# Patient Record
Sex: Female | Born: 1980 | Race: White | Hispanic: No | Marital: Married | Smoking: Former smoker
Health system: Southern US, Community
[De-identification: ages and names within clinical notes are randomized; demographics above are authoritative.]

## PROBLEM LIST (undated history)

## (undated) DIAGNOSIS — F32A Depression, unspecified: Secondary | ICD-10-CM

## (undated) DIAGNOSIS — F419 Anxiety disorder, unspecified: Secondary | ICD-10-CM

## (undated) DIAGNOSIS — E041 Nontoxic single thyroid nodule: Secondary | ICD-10-CM

## (undated) DIAGNOSIS — Z803 Family history of malignant neoplasm of breast: Secondary | ICD-10-CM

## (undated) DIAGNOSIS — D7589 Other specified diseases of blood and blood-forming organs: Secondary | ICD-10-CM

## (undated) DIAGNOSIS — R87629 Unspecified abnormal cytological findings in specimens from vagina: Secondary | ICD-10-CM

## (undated) DIAGNOSIS — F329 Major depressive disorder, single episode, unspecified: Secondary | ICD-10-CM

## (undated) DIAGNOSIS — R519 Headache, unspecified: Secondary | ICD-10-CM

## (undated) DIAGNOSIS — T8859XA Other complications of anesthesia, initial encounter: Secondary | ICD-10-CM

## (undated) DIAGNOSIS — IMO0001 Reserved for inherently not codable concepts without codable children: Secondary | ICD-10-CM

## (undated) DIAGNOSIS — K8020 Calculus of gallbladder without cholecystitis without obstruction: Secondary | ICD-10-CM

## (undated) DIAGNOSIS — N970 Female infertility associated with anovulation: Secondary | ICD-10-CM

## (undated) DIAGNOSIS — R51 Headache: Secondary | ICD-10-CM

## (undated) DIAGNOSIS — E8809 Other disorders of plasma-protein metabolism, not elsewhere classified: Secondary | ICD-10-CM

## (undated) DIAGNOSIS — T4145XA Adverse effect of unspecified anesthetic, initial encounter: Secondary | ICD-10-CM

## (undated) DIAGNOSIS — Z8619 Personal history of other infectious and parasitic diseases: Secondary | ICD-10-CM

## (undated) DIAGNOSIS — O43129 Velamentous insertion of umbilical cord, unspecified trimester: Secondary | ICD-10-CM

## (undated) DIAGNOSIS — K219 Gastro-esophageal reflux disease without esophagitis: Secondary | ICD-10-CM

## (undated) HISTORY — DX: Personal history of other infectious and parasitic diseases: Z86.19

## (undated) HISTORY — DX: Gastro-esophageal reflux disease without esophagitis: K21.9

## (undated) HISTORY — DX: Unspecified abnormal cytological findings in specimens from vagina: R87.629

## (undated) HISTORY — DX: Other complications of anesthesia, initial encounter: T88.59XA

## (undated) HISTORY — DX: Anxiety disorder, unspecified: F41.9

## (undated) HISTORY — PX: INDUCED ABORTION: SHX677

## (undated) HISTORY — PX: CHOLECYSTECTOMY: SHX55

## (undated) HISTORY — DX: Family history of malignant neoplasm of breast: Z80.3

## (undated) HISTORY — DX: Major depressive disorder, single episode, unspecified: F32.9

## (undated) HISTORY — DX: Female infertility associated with anovulation: N97.0

## (undated) HISTORY — PX: UPPER GI ENDOSCOPY: SHX6162

## (undated) HISTORY — PX: COLONOSCOPY: SHX174

## (undated) HISTORY — DX: Adverse effect of unspecified anesthetic, initial encounter: T41.45XA

## (undated) HISTORY — DX: Other specified diseases of blood and blood-forming organs: D75.89

## (undated) HISTORY — DX: Depression, unspecified: F32.A

---

## 2008-03-11 ENCOUNTER — Other Ambulatory Visit: Admission: RE | Admit: 2008-03-11 | Discharge: 2008-03-11 | Payer: Self-pay | Admitting: Family Medicine

## 2008-03-15 ENCOUNTER — Encounter: Admission: RE | Admit: 2008-03-15 | Discharge: 2008-03-15 | Payer: Self-pay | Admitting: Gastroenterology

## 2008-03-15 ENCOUNTER — Encounter: Admission: RE | Admit: 2008-03-15 | Discharge: 2008-03-15 | Payer: Self-pay | Admitting: Family Medicine

## 2008-06-06 ENCOUNTER — Emergency Department (HOSPITAL_COMMUNITY): Admission: EM | Admit: 2008-06-06 | Discharge: 2008-06-06 | Payer: Self-pay | Admitting: Emergency Medicine

## 2008-11-08 ENCOUNTER — Emergency Department (HOSPITAL_COMMUNITY): Admission: EM | Admit: 2008-11-08 | Discharge: 2008-11-09 | Payer: Self-pay | Admitting: Emergency Medicine

## 2009-05-01 ENCOUNTER — Other Ambulatory Visit: Admission: RE | Admit: 2009-05-01 | Discharge: 2009-05-01 | Payer: Self-pay | Admitting: Family Medicine

## 2009-09-18 ENCOUNTER — Encounter: Admission: RE | Admit: 2009-09-18 | Discharge: 2009-09-18 | Payer: Self-pay | Admitting: Gastroenterology

## 2009-11-02 ENCOUNTER — Ambulatory Visit (HOSPITAL_COMMUNITY): Admission: RE | Admit: 2009-11-02 | Discharge: 2009-11-03 | Payer: Self-pay | Admitting: General Surgery

## 2009-11-02 ENCOUNTER — Encounter (INDEPENDENT_AMBULATORY_CARE_PROVIDER_SITE_OTHER): Payer: Self-pay | Admitting: General Surgery

## 2010-03-25 ENCOUNTER — Encounter: Payer: Self-pay | Admitting: Gastroenterology

## 2010-05-18 LAB — COMPREHENSIVE METABOLIC PANEL
AST: 29 U/L (ref 0–37)
Alkaline Phosphatase: 58 U/L (ref 39–117)
Calcium: 10 mg/dL (ref 8.4–10.5)
Chloride: 106 mEq/L (ref 96–112)
Creatinine, Ser: 0.63 mg/dL (ref 0.4–1.2)
GFR calc non Af Amer: >60 mL/min (ref >60)
Potassium: 3.7 mEq/L (ref 3.5–5.1)
Sodium: 139 mEq/L (ref 135–145)
Total Bilirubin: 0.5 mg/dL (ref 0.3–1.2)

## 2010-05-18 LAB — CBC
MCHC: 34.5 g/dL (ref 30.0–36.0)
MCV: 84 fL (ref 78.0–100.0)
RDW: 12.9 % (ref 11.5–15.5)

## 2010-05-18 LAB — DIFFERENTIAL
Basophils Relative: 0 % (ref 0–1)
Lymphocytes Relative: 26 % (ref 12–46)
Lymphs Abs: 2.5 10*3/uL (ref 0.7–4.0)
Monocytes Absolute: 0.7 10*3/uL (ref 0.1–1.0)

## 2010-05-18 LAB — PREGNANCY, URINE: Preg Test, Ur: NEGATIVE

## 2010-05-18 LAB — SURGICAL PCR SCREEN: Staphylococcus aureus: POSITIVE — AB

## 2010-06-19 ENCOUNTER — Other Ambulatory Visit: Payer: Self-pay | Admitting: Family Medicine

## 2010-06-19 ENCOUNTER — Other Ambulatory Visit (HOSPITAL_COMMUNITY)
Admission: RE | Admit: 2010-06-19 | Discharge: 2010-06-19 | Disposition: A | Discharge status: Home / Self Care | Payer: Managed Care, Other (non HMO) | Source: Ambulatory Visit | Attending: Family Medicine | Admitting: Family Medicine

## 2010-06-19 DIAGNOSIS — E049 Nontoxic goiter, unspecified: Secondary | ICD-10-CM

## 2010-06-19 DIAGNOSIS — Z124 Encounter for screening for malignant neoplasm of cervix: Secondary | ICD-10-CM | POA: Insufficient documentation

## 2010-06-22 ENCOUNTER — Other Ambulatory Visit: Payer: Self-pay

## 2010-06-26 ENCOUNTER — Ambulatory Visit
Admission: RE | Admit: 2010-06-26 | Discharge: 2010-06-26 | Disposition: A | Discharge status: Home / Self Care | Payer: Managed Care, Other (non HMO) | Source: Ambulatory Visit | Attending: Family Medicine | Admitting: Family Medicine

## 2010-06-26 DIAGNOSIS — E049 Nontoxic goiter, unspecified: Secondary | ICD-10-CM

## 2010-10-23 ENCOUNTER — Emergency Department (HOSPITAL_COMMUNITY): Payer: No Typology Code available for payment source

## 2010-10-23 ENCOUNTER — Observation Stay (HOSPITAL_COMMUNITY)
Admission: EM | Admit: 2010-10-23 | Discharge: 2010-10-24 | Disposition: A | Discharge status: Home / Self Care | Payer: No Typology Code available for payment source | Attending: General Surgery | Admitting: General Surgery

## 2010-10-23 DIAGNOSIS — K219 Gastro-esophageal reflux disease without esophagitis: Secondary | ICD-10-CM | POA: Insufficient documentation

## 2010-10-23 DIAGNOSIS — Z331 Pregnant state, incidental: Secondary | ICD-10-CM

## 2010-10-23 DIAGNOSIS — S5010XA Contusion of unspecified forearm, initial encounter: Secondary | ICD-10-CM | POA: Insufficient documentation

## 2010-10-23 DIAGNOSIS — O9989 Other specified diseases and conditions complicating pregnancy, childbirth and the puerperium: Principal | ICD-10-CM | POA: Insufficient documentation

## 2010-10-23 DIAGNOSIS — Y92410 Unspecified street and highway as the place of occurrence of the external cause: Secondary | ICD-10-CM | POA: Insufficient documentation

## 2010-10-23 DIAGNOSIS — S301XXA Contusion of abdominal wall, initial encounter: Secondary | ICD-10-CM | POA: Insufficient documentation

## 2010-10-23 DIAGNOSIS — J45909 Unspecified asthma, uncomplicated: Secondary | ICD-10-CM | POA: Insufficient documentation

## 2010-10-23 LAB — DIFFERENTIAL
Lymphocytes Relative: 18 % (ref 12–46)
Monocytes Absolute: 0.8 10*3/uL (ref 0.1–1.0)
Neutro Abs: 8.1 10*3/uL — ABNORMAL HIGH (ref 1.7–7.7)
Neutrophils Relative %: 74 % (ref 43–77)

## 2010-10-23 LAB — COMPREHENSIVE METABOLIC PANEL
Albumin: 3.7 g/dL (ref 3.5–5.2)
Alkaline Phosphatase: 63 U/L (ref 39–117)
BUN: 8 mg/dL (ref 6–23)
Chloride: 105 mEq/L (ref 96–112)
Glucose, Bld: 105 mg/dL — ABNORMAL HIGH (ref 70–99)
Total Bilirubin: 0.3 mg/dL (ref 0.3–1.2)
Total Protein: 6.9 g/dL (ref 6.0–8.3)

## 2010-10-23 LAB — TYPE AND SCREEN
ABO/RH(D): O POS
Antibody Screen: NEGATIVE

## 2010-10-23 LAB — MRSA PCR SCREENING: MRSA by PCR: NEGATIVE

## 2010-10-23 LAB — CBC
MCH: 29.3 pg (ref 26.0–34.0)
MCV: 83.2 fL (ref 78.0–100.0)
RBC: 4.81 MIL/uL (ref 3.87–5.11)
RDW: 13.3 % (ref 11.5–15.5)
WBC: 11 10*3/uL — ABNORMAL HIGH (ref 4.0–10.5)

## 2010-10-24 LAB — CBC
HCT: 38.4 % (ref 36.0–46.0)
MCHC: 34.4 g/dL (ref 30.0–36.0)
Platelets: 285 10*3/uL (ref 150–400)
RBC: 4.55 MIL/uL (ref 3.87–5.11)
WBC: 7.8 10*3/uL (ref 4.0–10.5)

## 2010-10-24 LAB — DIFFERENTIAL
Basophils Relative: 1 % (ref 0–1)
Eosinophils Absolute: 0.1 10*3/uL (ref 0.0–0.7)
Eosinophils Relative: 2 % (ref 0–5)
Lymphs Abs: 2.2 10*3/uL (ref 0.7–4.0)
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
## 2010-12-06 LAB — ABO/RH

## 2010-12-06 LAB — GC/CHLAMYDIA PROBE AMP, GENITAL: Chlamydia: NEGATIVE

## 2011-03-05 HISTORY — DX: Velamentous insertion of umbilical cord, unspecified trimester: O43.129

## 2011-03-05 NOTE — L&D Delivery Note (Signed)
Delivery Note  Complete dilation at 0122 Onset of pushing at 0130 FHR second stage 140's; no decelerations present during intermittent assessment  Anesthesia: none  Delivery of a viable female infant at 0230 by L. Lorre Munroe, SNM and Wiliam Ke, CNM in lithotomy position; OA-ROA.  Nuchal Cord: none Cord double clamped after cessation of pulsation, cut by FOB; 3 vessels noted Cord blood sample collected for T&S  Partial separation of placenta at 0245; 3VC with velamentous insertion and avulsion at vessels with gentle cord traction.  Bleeding minimal; attempted manual removal of placenta by T. Fredric Mare x 3 attempts. Adherent placenta in fundus.  Dr. Seymour Bars consulted at 0300 for retained and adherent placenta. PCN allergy; flagyl 500mg  IV now to continue x3 doses for prophylaxis  2nd degree laceration identified.  Repair deferred following D&E  Est. Blood Loss (mL): 300  Complications: retained and adherent placenta; avulsion of cord at velamentous insertion  Mom to OR.  Baby to nursery-stable.  Juanetta Beets, SNM Marlinda Mike 05/15/2011, 3:34 AM

## 2011-05-13 ENCOUNTER — Encounter (HOSPITAL_COMMUNITY): Payer: Self-pay | Admitting: *Deleted

## 2011-05-13 ENCOUNTER — Telehealth (HOSPITAL_COMMUNITY): Payer: Self-pay | Admitting: *Deleted

## 2011-05-13 NOTE — Telephone Encounter (Signed)
Preadmission screen  

## 2011-05-14 ENCOUNTER — Inpatient Hospital Stay (HOSPITAL_COMMUNITY)
Admission: AD | Admit: 2011-05-14 | Discharge: 2011-05-17 | DRG: 767 | Disposition: A | Discharge status: Home / Self Care | Payer: Managed Care, Other (non HMO) | Attending: Obstetrics and Gynecology | Admitting: Obstetrics and Gynecology

## 2011-05-14 ENCOUNTER — Encounter (HOSPITAL_COMMUNITY): Payer: Self-pay | Admitting: *Deleted

## 2011-05-14 DIAGNOSIS — O43129 Velamentous insertion of umbilical cord, unspecified trimester: Secondary | ICD-10-CM | POA: Diagnosis present

## 2011-05-14 DIAGNOSIS — O139 Gestational [pregnancy-induced] hypertension without significant proteinuria, unspecified trimester: Secondary | ICD-10-CM | POA: Diagnosis present

## 2011-05-14 DIAGNOSIS — IMO0001 Reserved for inherently not codable concepts without codable children: Secondary | ICD-10-CM

## 2011-05-14 DIAGNOSIS — Z23 Encounter for immunization: Secondary | ICD-10-CM

## 2011-05-14 HISTORY — DX: Reserved for inherently not codable concepts without codable children: IMO0001

## 2011-05-14 LAB — CBC
MCH: 25.4 pg — ABNORMAL LOW (ref 26.0–34.0)
Platelets: 293 10*3/uL (ref 150–400)
RBC: 4.81 MIL/uL (ref 3.87–5.11)
WBC: 14 10*3/uL — ABNORMAL HIGH (ref 4.0–10.5)

## 2011-05-14 LAB — COMPREHENSIVE METABOLIC PANEL
AST: 19 U/L (ref 0–37)
CO2: 22 mEq/L (ref 19–32)
Calcium: 9.7 mg/dL (ref 8.4–10.5)
Creatinine, Ser: 0.66 mg/dL (ref 0.50–1.10)
GFR calc non Af Amer: >90 mL/min (ref >90)

## 2011-05-14 LAB — RPR: RPR Ser Ql: NONREACTIVE

## 2011-05-14 MED ORDER — PROMETHAZINE HCL 25 MG/ML IJ SOLN
12.5000 mg | INTRAMUSCULAR | Status: AC
  Administered 2011-05-14: 12.5 mg via INTRAVENOUS
  Filled 2011-05-14: qty 1

## 2011-05-14 MED ORDER — PANTOPRAZOLE SODIUM 40 MG PO TBEC
40.0000 mg | DELAYED_RELEASE_TABLET | Freq: Every day | ORAL | Status: DC
  Administered 2011-05-14: 40 mg via ORAL
  Filled 2011-05-14 (×2): qty 1

## 2011-05-14 MED ORDER — LACTATED RINGERS IV BOLUS (SEPSIS): 500.0000 mL | Freq: Once | INTRAVENOUS | Status: DC

## 2011-05-14 MED ORDER — LACTATED RINGERS IV SOLN
INTRAVENOUS | Status: DC
  Administered 2011-05-14: 20:00:00 via INTRAVENOUS

## 2011-05-14 MED ORDER — OXYTOCIN 20 UNITS IN LACTATED RINGERS INFUSION - SIMPLE
125.0000 mL/h | Freq: Once | INTRAVENOUS | Status: AC
  Administered 2011-05-15: 999 mL/h via INTRAVENOUS

## 2011-05-14 MED ORDER — LACTATED RINGERS IV SOLN: 500.0000 mL | INTRAVENOUS | Status: DC | PRN

## 2011-05-14 MED ORDER — ONDANSETRON HCL 4 MG/2ML IJ SOLN: 4.0000 mg | Freq: Four times a day (QID) | INTRAMUSCULAR | Status: DC | PRN

## 2011-05-14 MED ORDER — RISAQUAD PO CAPS
1.0000 | ORAL_CAPSULE | Freq: Every day | ORAL | Status: DC
  Administered 2011-05-14: 1 via ORAL
  Filled 2011-05-14 (×2): qty 1

## 2011-05-14 MED ORDER — OXYTOCIN 10 UNIT/ML IJ SOLN: 10.0000 [IU] | Freq: Once | INTRAMUSCULAR | Status: DC

## 2011-05-14 MED ORDER — NALBUPHINE SYRINGE 5 MG/0.5 ML
10.0000 mg | INJECTION | INTRAMUSCULAR | Status: DC | PRN
  Administered 2011-05-14: 10 mg via SUBCUTANEOUS
  Filled 2011-05-14: qty 0.5
  Filled 2011-05-14: qty 1

## 2011-05-14 MED ORDER — FLEET ENEMA 7-19 GM/118ML RE ENEM: 1.0000 | ENEMA | RECTAL | Status: DC | PRN

## 2011-05-14 MED ORDER — CITRIC ACID-SODIUM CITRATE 334-500 MG/5ML PO SOLN
30.0000 mL | ORAL | Status: DC | PRN
  Administered 2011-05-15: 30 mL via ORAL
  Filled 2011-05-14: qty 15

## 2011-05-14 MED ORDER — OXYTOCIN 20 UNITS IN LACTATED RINGERS INFUSION - SIMPLE
1.0000 m[IU]/min | INTRAVENOUS | Status: DC
  Administered 2011-05-14: 2 m[IU]/min via INTRAVENOUS
  Administered 2011-05-14: 12 m[IU]/min via INTRAVENOUS
  Filled 2011-05-14: qty 1000

## 2011-05-14 MED ORDER — ACETAMINOPHEN 325 MG PO TABS: 650.0000 mg | ORAL_TABLET | ORAL | Status: DC | PRN

## 2011-05-14 MED ORDER — OXYTOCIN BOLUS FROM INFUSION
500.0000 mL | Freq: Once | INTRAVENOUS | Status: DC
  Filled 2011-05-14: qty 500

## 2011-05-14 MED ORDER — PROMETHAZINE HCL 25 MG/ML IJ SOLN
12.5000 mg | INTRAMUSCULAR | Status: AC
  Administered 2011-05-15: 12.5 mg via INTRAVENOUS
  Filled 2011-05-14 (×2): qty 1

## 2011-05-14 MED ORDER — OXYCODONE-ACETAMINOPHEN 5-325 MG PO TABS: 1.0000 | ORAL_TABLET | ORAL | Status: DC | PRN

## 2011-05-14 MED ORDER — NALBUPHINE SYRINGE 5 MG/0.5 ML
10.0000 mg | INJECTION | Freq: Once | INTRAMUSCULAR | Status: AC
  Administered 2011-05-14: 10 mg via INTRAVENOUS
  Filled 2011-05-14: qty 1

## 2011-05-14 MED ORDER — IBUPROFEN 600 MG PO TABS: 600.0000 mg | ORAL_TABLET | Freq: Four times a day (QID) | ORAL | Status: DC | PRN

## 2011-05-14 MED ORDER — LIDOCAINE HCL (PF) 1 % IJ SOLN
30.0000 mL | INTRAMUSCULAR | Status: DC | PRN
  Filled 2011-05-14: qty 30

## 2011-05-14 MED ORDER — OMEPRAZOLE MAGNESIUM 20 MG PO TBEC: 20.0000 mg | DELAYED_RELEASE_TABLET | Freq: Every morning | ORAL | Status: DC

## 2011-05-14 MED ORDER — NALBUPHINE SYRINGE 5 MG/0.5 ML
10.0000 mg | INJECTION | INTRAMUSCULAR | Status: AC
  Administered 2011-05-15: 10 mg via INTRAVENOUS
  Filled 2011-05-14: qty 0.5

## 2011-05-14 NOTE — Progress Notes (Signed)
S:       Feeling frustrated and having difficulty coping with labor at this time; requesting analgesia     Tolerating contractions fair-poor despite hydrotherapy and multiple position changes/continuous          one:one labor support.  O:  VS: Blood pressure 149/89, pulse 108, temperature 98.5 F (36.9 C), temperature source Axillary, resp. rate 20, height 5\' 2"  (1.575 m), weight 97.07 kg (214 lb). Repeat BP 159/96        FHR : baseline 140 / variability min-mod / accels + / decels episodic early        Toco: contractions every 2-3 minutes / 45-60 / irregular intensity mild-mod via palpation        Cervix : 7/90/0 (+1 station during cxn)        Membranes: R/ clear  A: Protracted active phase labor     FHR category II      Ineffective coping     Elevated BP  P:  Extensive discussion regarding options regarding IV narcotics vs epidural vs expectant               management/comfort measures       IV nubain for analgesia per request       Continue oxytocin augmentation       Continuous EFM and toco       Re-assess within 1-2 hours with anticipated need for regional anesthesia; low threshold to              recommend c/s if no labor progress at next cervical assessment.     Juanetta Beets SNM Marlinda Mike 05/14/2011, 11:22 PM

## 2011-05-14 NOTE — Progress Notes (Signed)
Patient moaning during contractions but falls asleep in between. Lauren [name] is rubbing her back and applying counter-pressure during contractions.  Contractions are 2-3 minutes apart but short in duration.

## 2011-05-14 NOTE — Progress Notes (Signed)
Colon Flattery notified of pt presenting for labor check.  En route to hospital.  Will be in to check pt.

## 2011-05-14 NOTE — Progress Notes (Signed)
Tub temp 98.7

## 2011-05-14 NOTE — Progress Notes (Signed)
Patient C/O repeated toco and ultrasound adjustments, can we take off. Explained needed due to Pitocin administration and will stop adjustments as soon as possible. Candise Che, RN

## 2011-05-14 NOTE — MAU Note (Signed)
Pt states she had her membrames stripped in the office yesterday and has been contracting all night

## 2011-05-14 NOTE — Progress Notes (Signed)
S: Feeling increasing fatigue and decreased pain tolerance for ctx.      O:  VS: Blood pressure 140/94, pulse 110, temperature 99.1 F (37.3 C), temperature source Oral, resp. rate 18, height 5\' 2"  (1.575 m), weight 214 lb (97.07 kg).        FHR: baseline 150 / variability mod / accels present / decels early        Toco: contractions every 2-3 minutes /45-60 seconds/ MVU's 150-170        Cervix : 5/90/0        Membranes: R/clear  A: protracted active phase of labor     FHR category 1  P: IUPC placed for assessment of MVU    Extensive discussion regarding risks/benefits of augmentation versus expectant management     DC IUPC following MVU assessment per client request to maintain mobility and ability to utilize birthing tub for hydrotherapy    Oxytocin augmentation    Nubain/phenergan for rest    IV hydration    Juanetta Beets, SNM Marlinda Mike 05/14/2011, 5:34 PM

## 2011-05-14 NOTE — MAU Note (Signed)
0725 peri care provided.  Pad/panties on. Pt breathing well with ctx's.  Husband, Mellody Dance and doula, Maralyn Sago at bedside- supportive.  Sheliah Hatch CNM in discussing plan of care (is available by phone) orders place.  Pt may eat, pain med (nubaine sq) available is needed/desired.

## 2011-05-14 NOTE — Progress Notes (Signed)
Patient moved to water, a lot of time spent adjusting monitor and trying to pick up continuous FHR with telemetry. Doula tubside and Mellody Dance in tub with patient. Pt complaining about straps for monitor. Candise Che, RN

## 2011-05-14 NOTE — Progress Notes (Signed)
Lauren [name] is a 31 y.o. G3P0020 at [redacted]w[redacted]d by LMP admitted for rupture of membranes, latent labor  Subjective: Reports increased intensity in ctx since shower 30 min ago, (+) rectal pressure with some ctx, laboring in BR on toilet. Coping well with ctx, using breathing techniques for relaxation. PO hydration, voids small amounts q 30-40 min.  Denies HA / N/V, epigastric pain  Objective: BP 141/94  Pulse 102  Temp(Src) 99.1 F (37.3 C) (Oral)  Resp 20  Ht 5\' 2"  (1.575 m)  Wt 97.07 kg (214 lb)  BMI 39.14 kg/m2       FHT:  FHR: 140 bpm, variability moderate, no decel's, intermittent EFM UC:   regular, every 2-4 minutes SVE:   Dilation: 5 Effacement (%): 90 Station: -1 Exam by:: Renae Fickle, CNM  Labs: pending  Assessment / Plan: Spontaneous labor, progressing normally  Labor: latent phase Preeclampsia:  no signs or symptoms of toxicity, elevated BP with labor pains, will draw PIH labs for baseline Fetal Wellbeing:  Category I Pain Control:  Labor support without medications and hydrotherapy I/D:  n/a Anticipated MOD:  NSVD  Jullien Granquist 05/14/2011, 10:41 AM

## 2011-05-14 NOTE — Progress Notes (Addendum)
Patient ID: Lauren [name], female   DOB: 14-Dec-1980, 30 y.o.   MRN: [id number]  S: Feeling increasing intensity and frequency of uterine ctx; reports some low back discomfort and pressure. Coping well.   O:  VS: Blood pressure 135/80, pulse 82, temperature 99 F (37.2 C), temperature source Oral, resp. rate 20, height 5\' 2"  (1.575 m), weight 97.07 kg (214 lb).         FHR : Intermittent assessment: 145; acceleration present with scalp stimulation.          Toco: contractions every 2-4 minutes lasting 50-60 seconds; moderate intensity to palpation        Cervix : 5/90/0        Membranes: R/clear        Position: LOP        PIH labs: reviewed; NML: no evidence of preeclampsia    A: Spontaneous active phase labor; normal FHR characteristics; posterior occiput position     P: Expectant management; hydrotherapy/doula support prn comfort; abdominal binding with rebozo and upright/lateral positioning to facilitate improved fetal positioning; anticipate NSB.    Juanetta Beets, SNM Lauren [name] 05/14/2011, 1:55 PM

## 2011-05-14 NOTE — H&P (Signed)
KATHERYNE GORR is a 31 y.o. G3P0020 at [redacted]w[redacted]d presenting for labor check. Pt notes regular contractions since membrane strip in office yesterday (was 3/80/-1 at that time), now with increased intensity since 5:30 am. Good fetal movement, No vaginal bleeding, no leaking fluid. Coping well with contractions, using Elige Radon method and spouse / doula for support, plans water birth.   Prenatal Course Source of Care: [facility], Wiliam Ke, CNM primary,  with onset of care at 10 weeks Pregnancy complications or risk: velamentous cord insertion noted on 22 wks sono, monitored in 3rd trim for low nl AFI. Current medications: Prescriptions prior to admission  Medication Sig Dispense Refill  . omeprazole (PRILOSEC OTC) 20 MG tablet Take 20 mg by mouth every morning.       . Prenatal Vit-Fe Fumarate-FA (PRENATAL MULTIVITAMIN) TABS Take 1 tablet by mouth every morning.       . Probiotic Product (PROBIOTIC FORMULA PO) Take 1 capsule by mouth every morning.       . pyridOXINE (VITAMIN B-6) 50 MG tablet Take 50 mg by mouth every morning.        Allergies  Allergen Reactions  . Other Other (See Comments)    Problems with "general anaesthesia" due to pseudocholinesterase deficiency   . Penicillins Other (See Comments)    Childhood allergy; reaction unknown  . Ritalin (Methylphenidate Hcl) Other (See Comments)    ADHD/AntiNarcolepsy/Anti-obesity/ Anorexiants cause muscle spasms    OB History    Grav Para Term Preterm Abortions TAB SAB Ect Mult Living   3 0   2 1 1    0     Past Medical History  Diagnosis Date  . Other umbilical cord complications during labor and delivery, unspecified as to episode of care     velamintous insertion  . Female infertility associated with anovulation   . Anxiety   . Asthma   . Depression   . GERD (gastroesophageal reflux disease)   . Female infertility associated with anovulation   . Family history of malignant neoplasm of breast   . Goiter, unspecified   . History of  chicken pox   . Other specified diseases of blood and blood-forming organs     pseudocholinesterase deficiency  . Nontoxic uninodular goiter   . Complication of anesthesia     states allergic to general anesthesia   Past Surgical History  Procedure Date  . Cholecystectomy   . Colonoscopy    Family History: family history includes Cancer in her maternal grandmother; Hypertension in her mother; and Immunodeficiency in her father. Social History:  does not have a smoking history on file. She does not have any smokeless tobacco history on file. Her alcohol and drug histories not on file.  Review of Systems - Negative except as noted   Dilation: 4 Effacement (%): 100 Station: 0 Exam by:: D Jomel Whittlesey CNM Vertex presentation  Blood pressure 141/88, pulse 111, temperature 98.4 F (36.9 C), temperature source Oral, resp. rate 20, height 5\' 2"  (1.575 m), weight 97.07 kg (214 lb).  Physical Exam:  General: NAD Heart: RRR, no murmurs Lungs: CTA b/l  -Abd: Soft, NT, EFW 7.5 lbs  Ext: no edema Neuro: DTRs normal    Membranes: SROM with exam, cl AF, forebag in place, will defer further manipulation at this time given Hx VCI Vaginal bleeding: none Speculum Exam: n/a  FHR:  Baseline rate 135   Variability moderate  Accelerations present, (+) scalp stim  Decelerations none Contractions: Frequency 4  Duration 60  Intensity  mild/moderate  Pertinent Labs/Studies:  CBC / RPR pending  Prenatal labs: ABO, Rh: O/pos (10/04 0000) Antibody: NEG (08/21 1520) Rubella:  immune RPR: Nonreactive (10/04 0000)  HBsAg: Negative (10/04 0000)  HIV: Non-reactive (10/04 0000)  GBS: Negative (02/08 0000)  1 hr Glucola 128  Genetic screening normal ultrascreen and AFP1 Ultrasound: Weeks 18 Result nl FEMALE anatomy, posterior placenta, velamentous cord insert on F/U scant at 22 wks 40 wks sono - EFW 7-5, nl AFI, AC 39%  Assessment: 31 y.o. G3P0020 at [redacted]w[redacted]d, suspect VCI  1. Labor: latent labor for past  20 hours, expect onset active phase with SROM 2. Fetal Wellbeing: Category 1  3. Pain Control: desires natural labor, low intervention, water birth 4. GBS: neg   Plan:  1. Admit to BS 2. Routine L&D orders 3. Hydrotherapy / labor support by doula and spouse / Analgesia/anesthesia PRN     Consultant: Dr. Alesia Banda 05/14/2011, 7:22 AM

## 2011-05-15 ENCOUNTER — Encounter (HOSPITAL_COMMUNITY)
Admission: AD | Disposition: A | Discharge status: Home / Self Care | Payer: Self-pay | Source: Home / Self Care | Attending: Obstetrics and Gynecology

## 2011-05-15 ENCOUNTER — Inpatient Hospital Stay (HOSPITAL_COMMUNITY): Payer: Managed Care, Other (non HMO) | Admitting: Anesthesiology

## 2011-05-15 ENCOUNTER — Encounter (HOSPITAL_COMMUNITY): Payer: Self-pay

## 2011-05-15 ENCOUNTER — Encounter (HOSPITAL_COMMUNITY): Payer: Self-pay | Admitting: Anesthesiology

## 2011-05-15 HISTORY — PX: DILATION AND EVACUATION: SHX1459

## 2011-05-15 SURGERY — DILATION AND EVACUATION, UTERUS
Anesthesia: Spinal | Site: Uterus | Wound class: Clean Contaminated

## 2011-05-15 MED ORDER — METRONIDAZOLE IN NACL 5-0.79 MG/ML-% IV SOLN
500.0000 mg | Freq: Three times a day (TID) | INTRAVENOUS | Status: AC
  Administered 2011-05-15 – 2011-05-16 (×4): 500 mg via INTRAVENOUS
  Filled 2011-05-15 (×4): qty 100

## 2011-05-15 MED ORDER — HYDROCHLOROTHIAZIDE 25 MG PO TABS
25.0000 mg | ORAL_TABLET | Freq: Every day | ORAL | Status: DC
  Administered 2011-05-15 – 2011-05-17 (×3): 25 mg via ORAL
  Filled 2011-05-15 (×4): qty 1

## 2011-05-15 MED ORDER — SENNOSIDES-DOCUSATE SODIUM 8.6-50 MG PO TABS
2.0000 | ORAL_TABLET | Freq: Every day | ORAL | Status: DC
  Administered 2011-05-15 – 2011-05-16 (×2): 2 via ORAL

## 2011-05-15 MED ORDER — ONDANSETRON HCL 4 MG/2ML IJ SOLN: 4.0000 mg | INTRAMUSCULAR | Status: DC | PRN

## 2011-05-15 MED ORDER — LACTATED RINGERS IV SOLN
INTRAVENOUS | Status: AC | PRN
  Administered 2011-05-15: 11:00:00 via INTRAVENOUS

## 2011-05-15 MED ORDER — OXYTOCIN 10 UNIT/ML IJ SOLN
INTRAMUSCULAR | Status: DC | PRN
  Administered 2011-05-15 (×2): 20 [IU]

## 2011-05-15 MED ORDER — ZOLPIDEM TARTRATE 5 MG PO TABS: 5.0000 mg | ORAL_TABLET | Freq: Every evening | ORAL | Status: DC | PRN

## 2011-05-15 MED ORDER — DIBUCAINE 1 % RE OINT
1.0000 "application " | TOPICAL_OINTMENT | RECTAL | Status: DC | PRN
  Administered 2011-05-15: 1 via RECTAL
  Filled 2011-05-15: qty 28

## 2011-05-15 MED ORDER — SILVER NITRATE-POT NITRATE 75-25 % EX MISC
CUTANEOUS | Status: AC
  Filled 2011-05-15: qty 1

## 2011-05-15 MED ORDER — PHENYLEPHRINE 40 MCG/ML (10ML) SYRINGE FOR IV PUSH (FOR BLOOD PRESSURE SUPPORT)
PREFILLED_SYRINGE | INTRAVENOUS | Status: AC
  Filled 2011-05-15: qty 5

## 2011-05-15 MED ORDER — OXYCODONE-ACETAMINOPHEN 5-325 MG PO TABS
1.0000 | ORAL_TABLET | ORAL | Status: DC | PRN
  Administered 2011-05-15 – 2011-05-16 (×5): 1 via ORAL
  Filled 2011-05-15 (×5): qty 1

## 2011-05-15 MED ORDER — SODIUM CHLORIDE 0.9 % IJ SOLN
3.0000 mL | Freq: Two times a day (BID) | INTRAMUSCULAR | Status: DC
  Administered 2011-05-15 – 2011-05-16 (×3): 3 mL via INTRAVENOUS

## 2011-05-15 MED ORDER — BENZOCAINE-MENTHOL 20-0.5 % EX AERO
INHALATION_SPRAY | CUTANEOUS | Status: AC
  Filled 2011-05-15: qty 56

## 2011-05-15 MED ORDER — LANOLIN HYDROUS EX OINT: TOPICAL_OINTMENT | CUTANEOUS | Status: DC | PRN

## 2011-05-15 MED ORDER — WITCH HAZEL-GLYCERIN EX PADS
1.0000 | MEDICATED_PAD | CUTANEOUS | Status: DC | PRN
Start: 2011-05-15 — End: 2011-05-17

## 2011-05-15 MED ORDER — IBUPROFEN 600 MG PO TABS
600.0000 mg | ORAL_TABLET | Freq: Four times a day (QID) | ORAL | Status: DC
  Administered 2011-05-15 – 2011-05-17 (×9): 600 mg via ORAL
  Filled 2011-05-15 (×9): qty 1

## 2011-05-15 MED ORDER — BENZOCAINE-MENTHOL 20-0.5 % EX AERO
1.0000 "application " | INHALATION_SPRAY | CUTANEOUS | Status: DC | PRN
  Administered 2011-05-15: 1 via TOPICAL

## 2011-05-15 MED ORDER — PHENYLEPHRINE HCL 10 MG/ML IJ SOLN
INTRAMUSCULAR | Status: DC | PRN
  Administered 2011-05-15: 40 ug via INTRAVENOUS
  Administered 2011-05-15 (×3): 80 ug via INTRAVENOUS

## 2011-05-15 MED ORDER — DIPHENHYDRAMINE HCL 25 MG PO CAPS: 25.0000 mg | ORAL_CAPSULE | Freq: Four times a day (QID) | ORAL | Status: DC | PRN

## 2011-05-15 MED ORDER — KETOROLAC TROMETHAMINE 30 MG/ML IJ SOLN
INTRAMUSCULAR | Status: DC | PRN
  Administered 2011-05-15: 30 mg via INTRAVENOUS

## 2011-05-15 MED ORDER — ONDANSETRON HCL 4 MG PO TABS: 4.0000 mg | ORAL_TABLET | ORAL | Status: DC | PRN

## 2011-05-15 MED ORDER — SIMETHICONE 80 MG PO CHEW: 80.0000 mg | CHEWABLE_TABLET | ORAL | Status: DC | PRN

## 2011-05-15 MED ORDER — OXYTOCIN 10 UNIT/ML IJ SOLN
INTRAMUSCULAR | Status: AC
  Filled 2011-05-15: qty 4

## 2011-05-15 MED ORDER — PRENATAL MULTIVITAMIN CH
1.0000 | ORAL_TABLET | Freq: Every day | ORAL | Status: DC
  Administered 2011-05-15 – 2011-05-17 (×2): 1 via ORAL
  Filled 2011-05-15 (×3): qty 1

## 2011-05-15 MED ORDER — TETANUS-DIPHTH-ACELL PERTUSSIS 5-2.5-18.5 LF-MCG/0.5 IM SUSP
0.5000 mL | Freq: Once | INTRAMUSCULAR | Status: AC
  Administered 2011-05-16: 0.5 mL via INTRAMUSCULAR
  Filled 2011-05-15: qty 0.5

## 2011-05-15 MED ORDER — LACTATED RINGERS IV SOLN
INTRAVENOUS | Status: DC | PRN
  Administered 2011-05-15 (×2): via INTRAVENOUS

## 2011-05-15 MED ORDER — FENTANYL CITRATE 0.05 MG/ML IJ SOLN: 25.0000 ug | INTRAMUSCULAR | Status: DC | PRN

## 2011-05-15 SURGICAL SUPPLY — 21 items
CATH ROBINSON RED A/P 16FR (CATHETERS) ×2 IMPLANT
CLOTH BEACON ORANGE TIMEOUT ST (SAFETY) ×2 IMPLANT
DECANTER SPIKE VIAL GLASS SM (MISCELLANEOUS) ×2 IMPLANT
GLOVE BIO SURGEON STRL SZ 6.5 (GLOVE) ×4 IMPLANT
GOWN PREVENTION PLUS LG XLONG (DISPOSABLE) ×4 IMPLANT
KIT BERKELEY 1ST TRIMESTER 3/8 (MISCELLANEOUS) IMPLANT
NEEDLE SPNL 22GX3.5 QUINCKE BK (NEEDLE) ×2 IMPLANT
PACK VAGINAL MINOR WOMEN LF (CUSTOM PROCEDURE TRAY) ×2 IMPLANT
PAD PREP 24X48 CUFFED NSTRL (MISCELLANEOUS) ×2 IMPLANT
SET BERKELEY SUCTION TUBING (SUCTIONS) IMPLANT
SLEEVE SURGEON STRL (DRAPES) ×2 IMPLANT
SUT VIC AB 3-0 PS2 18 (SUTURE) ×2
SUT VIC AB 3-0 PS2 18XBRD (SUTURE) ×2 IMPLANT
SUT VICRYL RAPIDE 3 0 (SUTURE) ×4 IMPLANT
SYR CONTROL 10ML LL (SYRINGE) ×2 IMPLANT
TOWEL OR 17X24 6PK STRL BLUE (TOWEL DISPOSABLE) ×4 IMPLANT
VACURETTE 10 RIGID CVD (CANNULA) IMPLANT
VACURETTE 7MM CVD STRL WRAP (CANNULA) IMPLANT
VACURETTE 8 RIGID CVD (CANNULA) IMPLANT
VACURETTE 9 RIGID CVD (CANNULA) IMPLANT
WATER STERILE IRR 1000ML POUR (IV SOLUTION) IMPLANT

## 2011-05-15 NOTE — Anesthesia Preprocedure Evaluation (Signed)
Anesthesia Evaluation  Patient identified by MRN, date of birth, ID band Patient awake    Reviewed: Allergy & Precautions, H&P , Patient's Chart, lab work & pertinent test results  History of Anesthesia Complications (+) PSEUDOCHOLINESTERASE DEFICIENCY  Airway Mallampati: III TM Distance: >3 FB Neck ROM: full    Dental No notable dental hx.    Pulmonary  breath sounds clear to auscultation  Pulmonary exam normal       Cardiovascular Exercise Tolerance: Good Rhythm:regular Rate:Normal     Neuro/Psych    GI/Hepatic   Endo/Other  Morbid obesity  Renal/GU      Musculoskeletal   Abdominal   Peds  Hematology   Anesthesia Other Findings   Reproductive/Obstetrics                           Anesthesia Physical  Anesthesia Plan  ASA: III  Anesthesia Plan: Spinal   Post-op Pain Management:    Induction:   Airway Management Planned:   Additional Equipment:   Intra-op Plan:   Post-operative Plan:   Informed Consent: I have reviewed the patients History and Physical, chart, labs and discussed the procedure including the risks, benefits and alternatives for the proposed anesthesia with the patient or authorized representative who has indicated his/her understanding and acceptance.   Dental Advisory Given  Plan Discussed with: CRNA  Anesthesia Plan Comments: (Lab work confirmed with CRNA in room. Platelets okay. Discussed spinal anesthetic, and patient consents to the procedure:  included risk of possible headache,backache, failed block, allergic reaction, and nerve injury. This patient was asked if she had any questions or concerns before the procedure started. )        Anesthesia Quick Evaluation                                  Anesthesia Evaluation    History of Anesthesia Complications (+) PSEUDOCHOLINESTERASE DEFICIENCY  Airway       Dental    Pulmonary          Cardiovascular     Neuro/Psych    GI/Hepatic   Endo/Other  Morbid obesity  Renal/GU      Musculoskeletal   Abdominal   Peds  Hematology   Anesthesia Other Findings   Reproductive/Obstetrics                           Anesthesia Physical Anesthesia Plan  ASA: III  Anesthesia Plan: Spinal   Post-op Pain Management:    Induction:   Airway Management Planned:   Additional Equipment:   Intra-op Plan:   Post-operative Plan:   Informed Consent: I have reviewed the patients History and Physical, chart, labs and discussed the procedure including the risks, benefits and alternatives for the proposed anesthesia with the patient or authorized representative who has indicated his/her understanding and acceptance.   Dental Advisory Given  Plan Discussed with: CRNA  Anesthesia Plan Comments: (Lab work confirmed with CRNA in room. Platelets okay. Discussed spinal anesthetic, and patient consents to the procedure:  included risk of possible headache,backache, failed block, allergic reaction, and nerve injury. This patient was asked if she had any questions or concerns before the procedure started. )        Anesthesia Quick Evaluation

## 2011-05-15 NOTE — Transfer of Care (Signed)
Immediate Anesthesia Transfer of Care Note  Patient: Lauren [name]  Procedure(s) Performed: Procedure(s) (LRB): DILATATION AND EVACUATION (N/A)  Patient Location: PACU  Anesthesia Type: Spinal  Level of Consciousness: awake, alert  and oriented  Airway & Oxygen Therapy: Patient Spontanous Breathing  Post-op Assessment: Report given to PACU RN and Post -op Vital signs reviewed and stable  Post vital signs: stable  Complications: No apparent anesthesia complications

## 2011-05-15 NOTE — Progress Notes (Signed)
VSS, Awaiting OR

## 2011-05-15 NOTE — Progress Notes (Signed)
See delivery record

## 2011-05-15 NOTE — Anesthesia Procedure Notes (Addendum)
Spinal  Patient location during procedure: OR Preanesthetic Checklist Completed: patient identified, site marked, surgical consent, pre-op evaluation, timeout performed, IV checked, risks and benefits discussed and monitors and equipment checked Spinal Block Patient position: sitting Prep: DuraPrep Patient monitoring: heart rate, cardiac monitor, continuous pulse ox and blood pressure Approach: midline Location: L3-4 Injection technique: single-shot Needle Needle type: Sprotte  Needle gauge: 24 G Needle length: 9 cm Assessment Sensory level: T4 Additional Notes Spinal Dosage in OR  Xylocaine ml       1.2    

## 2011-05-15 NOTE — Progress Notes (Signed)
Anesthesia at the bs

## 2011-05-15 NOTE — Progress Notes (Signed)
Pt stable, Dr Seymour Bars at the bedside for retained placenta. Pt chooses to go to the or .

## 2011-05-15 NOTE — Progress Notes (Signed)
Pt with retained placenta

## 2011-05-15 NOTE — Progress Notes (Signed)
S: Feeling an urge to push; desires to be assessed     Tolerating contractions well since administration of IV      Nubain  O:  VS: Blood pressure 138/79, pulse 102, temperature 99.1 F (37.3 C), temperature source Oral, resp. rate 20, height 5\' 2"  (1.575 m), weight 97.07 kg (214 lb), unknown if currently breastfeeding.        FHR : baseline 140 / variability mod / accels + / decels         early/variable (episodic)        Toco: contractions every 2-3 minutes / mod-strong         intensity to palpation.         Cervix : C/C/+2        Membranes: R/clear  A: Second stage labor     FHR category I-II     Episodic elevation of BP     Coping well, good maternal expulsive efforts  P: Expectant management      Continue oxytocin augmentation      Anticipate NVB      Active management of the third stage      Update Dr. Seymour Bars of progress.    Lauren [name], SNM Lauren [name] 05/15/2011, 3:29 AM

## 2011-05-15 NOTE — Anesthesia Preprocedure Evaluation (Deleted)
Anesthesia Evaluation    History of Anesthesia Complications (+) PSEUDOCHOLINESTERASE DEFICIENCY  Airway       Dental   Pulmonary          Cardiovascular     Neuro/Psych    GI/Hepatic   Endo/Other  Morbid obesity  Renal/GU      Musculoskeletal   Abdominal   Peds  Hematology   Anesthesia Other Findings   Reproductive/Obstetrics                           Anesthesia Physical Anesthesia Plan  ASA: III  Anesthesia Plan: Spinal   Post-op Pain Management:    Induction:   Airway Management Planned:   Additional Equipment:   Intra-op Plan:   Post-operative Plan:   Informed Consent: I have reviewed the patients History and Physical, chart, labs and discussed the procedure including the risks, benefits and alternatives for the proposed anesthesia with the patient or authorized representative who has indicated his/her understanding and acceptance.   Dental Advisory Given  Plan Discussed with: CRNA  Anesthesia Plan Comments: (Lab work confirmed with CRNA in room. Platelets okay. Discussed spinal anesthetic, and patient consents to the procedure:  included risk of possible headache,backache, failed block, allergic reaction, and nerve injury. This patient was asked if she had any questions or concerns before the procedure started. )        Anesthesia Quick Evaluation

## 2011-05-15 NOTE — Progress Notes (Signed)
PPD 0 SVD and D&E for retained / adherent placenta  S:  Reports feeling tired but "overall pretty good"             Tolerating po/ No nausea or vomiting             Bleeding is moderate             Pain controlled with toradol dose in OR & percocet             Up ad lib / ambulatory  Newborn breast feeding  / Girl  ("Lauren [name]")   O:  A & O x 3              VS: Blood pressure 144/100, pulse 86, temperature 98.6 F (37 C), temperature source Oral, resp. rate 20, height 5\' 2"  (1.575 m), weight 97.07 kg (214 lb), SpO2 98.00%, unknown if currently breastfeeding.  LABS: pending  I&O:   + 1000  Lungs: Clear and unlabored  Heart: regular rate and rhythm / no mumurs  Abdomen: soft, non-tender, non-distended              Fundus: firm, non-tender, Ueven  Perineum: moderate edema / + hemorrhoids  Lochia: moderate  Extremities: 1+edema, no calf pain or tenderness    A: PPD # 0 SVD with D&E for retained/adherent placenta             Gestational hypertension - no evidence of PEC  Doing well - stable status  P:  Routine post partum order             Start HCTZ today - monitor BP and I&o  Flagyl 500 IV q8 x 24 hours (prophylaxis uterine exploration)  [name] 05/15/2011, 10:13 AM

## 2011-05-15 NOTE — Anesthesia Postprocedure Evaluation (Signed)
  Anesthesia Post-op Note  Patient: Lauren [name]  Procedure(s) Performed: Procedure(s) (LRB): DILATATION AND EVACUATION (N/A)  Patient is awake, responsive, moving her legs, and has signs of resolution of her numbness. Pain and nausea are reasonably well controlled. Vital signs are stable and clinically acceptable. Oxygen saturation is clinically acceptable. There are no apparent anesthetic complications at this time. Patient is ready for discharge.

## 2011-05-15 NOTE — Op Note (Signed)
05/14/2011 - 05/15/2011  5:21 AM  PATIENT:  Lauren [name]  31 y.o. female  PRE-OPERATIVE DIAGNOSIS:  retained and adherent placenta second degree laceration and bilateral vulva tear  POST-OPERATIVE DIAGNOSIS:  retained and adherent placenta second degree laceration  repeair of bilateral vulva tear  PROCEDURE:  Procedure(s): DILATATION AND EVACUATION, REPAIR OF VULVAR AND PERINEAL TEARS  SURGEON:  Surgeon(s): Genia Del, MD  ASSISTANTS: none   ANESTHESIA:   spinal  PROCEDURE:  Under spinal anesthesia the patient is in lithotomy position. She is prepped with Betadine on the suprapubic, vulvar and vaginal areas.  And draped as usual.  With the left hand bringing the fundus down we used the right-hand to proceed with a manual exploration of the intrauterine cavity. The plane between the placenta and decidua is difficult to find, but a large area of placenta is felt at the fundus.  A small succenturiate lobe had been removed with the cord at the time of delivery.  We succeed removing manually a small portion of placenta. We then insert the weighted speculum grasp the anterior lip of the cervix with a tenaculum and the proceed with a delicate curettage with a large sharp curette at the fundus. This curettage does not bring back much placental tissue but we think that it helped detach it because when we go back manually, we are now able to find the plane between the placenta and decidua and 2 large portions of the placenta are extracted.  The intrauterine cavity appears empty manually and the uterus now contracts very well. We go back with a large sharp curet as well and no further placenta is extracted. The patient was given Pitocin 40 in 1 L. Minimal vaginal bleeding is present.  The to vulvar tears were repaired with Vicryl rapid 3-0 in separate stitches.  The second degree perineal tear was repaired with Vicryl 2-0 and Vicryl rapid 3-0.  Hemostasis was adequate at all levels. The patient was  brought to recovery room in good and stable status. Note that the bladder was catheterized at the beginning of the intervention. The patient received metronidazole 500 mg IV before starting the procedure.  ESTIMATED BLOOD LOSS: 500 cc   Intake/Output Summary (Last 24 hours) at 05/15/11 0521 Last data filed at 05/15/11 0516  Gross per 24 hour  Intake   1700 ml  Output   1100 ml  Net    600 ml     BLOOD ADMINISTERED:none   LOCAL MEDICATIONS USED:  NONE  SPECIMEN:  Source of Specimen:  Placenta  DISPOSITION OF SPECIMEN:  PATHOLOGY  COUNTS:  YES  PLAN OF CARE: Transfer to PACU   [name] MD,  05/15/11 at 5:23 am

## 2011-05-15 NOTE — Progress Notes (Signed)
Wiliam Ke, CNM and L. MunichSNM at the bedside

## 2011-05-16 ENCOUNTER — Encounter (HOSPITAL_COMMUNITY): Payer: Self-pay | Admitting: *Deleted

## 2011-05-16 LAB — CBC
MCH: 25.4 pg — ABNORMAL LOW (ref 26.0–34.0)
MCHC: 32.3 g/dL (ref 30.0–36.0)
Platelets: 306 10*3/uL (ref 150–400)
RDW: 14.7 % (ref 11.5–15.5)

## 2011-05-16 MED ORDER — LABETALOL HCL 100 MG PO TABS
100.0000 mg | ORAL_TABLET | Freq: Two times a day (BID) | ORAL | Status: DC
  Administered 2011-05-16 – 2011-05-17 (×3): 100 mg via ORAL
  Filled 2011-05-16 (×5): qty 1

## 2011-05-16 NOTE — Anesthesia Postprocedure Evaluation (Signed)
  Anesthesia Post-op Note  Patient: Lauren [name]  Procedure(s) Performed: Procedure(s) (LRB): DILATATION AND EVACUATION (N/A)  Patient Location: PACU  Anesthesia Type: General  Level of Consciousness: awake, alert  and oriented  Airway and Oxygen Therapy: Patient Spontanous Breathing  Post-op Pain: none  Post-op Assessment: Post-op Vital signs reviewed and Patient's Cardiovascular Status Stable  Post-op Vital Signs: Reviewed and stable  Complications: No apparent anesthesia complications

## 2011-05-16 NOTE — Progress Notes (Signed)
PPD 1 SVD  S:  Reports feeling well, better since delivery, denies PEC s/s.             Tolerating po/ No nausea or vomiting             Bleeding is light             Pain controlled with Motrin             Up ad lib / ambulatory  Newborn  Information for the patient's newborn:  Darthy, Manganelli [[id number]]  female  breast feeding  / Lactation Consultant at [facility] for BFing support  O:  A & O x 3 NAD, generalized edema             VS:  Filed Vitals:   05/15/11 1200 05/15/11 1800 05/15/11 2100 05/16/11 0500  BP: 111/74 126/85 126/83 136/92  Pulse: 105 100 101 97  Temp: 98.3 F (36.8 C)  98.1 F (36.7 C) 98.3 F (36.8 C)  TempSrc:      Resp: 20 18 20 20   Height:      Weight:      SpO2: 98% 97% 97%     LABS:  Basename 05/16/11 0540 05/14/11 1037  WBC 14.7* 14.0*  HGB 9.5* 12.2  HCT 29.4* 37.5  PLT 306 293    I&O: I/O last 3 completed shifts: In: 2750 [P.O.:300; I.V.:2450] Out: 1400 [Urine:600; Blood:800] Net +700     Lungs: Clear and unlabored  Heart: regular rate and rhythm / no mumurs  Abdomen: soft, non-tender, non-distended              Fundus: firm, non-tender, U-1  Perineum: bruised, repair intact, mild edema  Lochia: small  Extremities:  +2 pedal and pretibial edema, no calf pain or tenderness, neg Homans    A/P: PPD # 1 31 y.o., [id number] SVD 3/13, D&E for retained placenta  Gestational HTN no evidence PEC  On HCTZ for edema, but BP trending up, will start Labetalol 100 mg PO BID    Doing well - stable status  Routine post partum orders  Anticipate discharge home in AM.   Alayla Dethlefs, CNM, MSN 05/16/2011, 10:05 AM

## 2011-05-16 NOTE — Addendum Note (Signed)
Addendum  created 05/16/11 0932 by Orlie Pollen, CRNA   Modules edited:Notes Section

## 2011-05-17 ENCOUNTER — Encounter (HOSPITAL_COMMUNITY): Payer: No Typology Code available for payment source

## 2011-05-17 ENCOUNTER — Encounter (HOSPITAL_COMMUNITY): Payer: Self-pay | Admitting: *Deleted

## 2011-05-17 MED ORDER — HYDROCHLOROTHIAZIDE 25 MG PO TABS: 25.0000 mg | ORAL_TABLET | Freq: Every day | ORAL | Status: AC

## 2011-05-17 MED ORDER — LABETALOL HCL 100 MG PO TABS: 100.0000 mg | ORAL_TABLET | Freq: Two times a day (BID) | ORAL | Status: AC

## 2011-05-17 MED ORDER — IBUPROFEN 600 MG PO TABS: 600.0000 mg | ORAL_TABLET | Freq: Four times a day (QID) | ORAL | Status: AC

## 2011-05-17 MED ORDER — OXYCODONE-ACETAMINOPHEN 5-325 MG PO TABS: 1.0000 | ORAL_TABLET | ORAL | Status: AC | PRN

## 2011-05-17 MED ORDER — BREAST PUMP MISC: 1.0000 [IU] | Status: DC | PRN

## 2011-05-17 NOTE — Discharge Instructions (Signed)
Breast Pumping Tips Pumping your breast milk is a good way to stimulate milk production and have a steady supply of breast milk for your infant. Pumping is most helpful during your infant's growth spurts, when involving dad or a family member, or when you are away. There are several types of pumps available. They can be purchased at a baby or maternity store. You can begin pumping soon after delivery, but some experts believe that you should wait about four weeks to give your infant a bottle. In general, the more you breastfeed or pump, the more milk you will have for your infant. It is also important to take good care of yourself. This will reduce stress and help your body to create a healthy supply of milk. Your caregiver or lactation consultant can give you the information and support you need in your efforts to breastfeed your infant. PUMPING BREAST MILK  Follow the tips below for successful breast pumping. Take care of yourself.  Drink enough water or fluids to keep urine clear or pale yellow. You may notice a thirsty feeling while breastfeeding. This is because your body needs more water to make breast milk. Keep a large water bottle handy. Make healthy drink choices such as unsweetened fruit juice, milk and water. Limit soda, coffee, and alcohol (wait 2 hours to feed or pump if you have an alcoholic drink.)   Eat a healthy, well-balanced diet rich in fruits, vegetables, and whole grains.   Exercise as recommended by your caregiver.   Get plenty of sleep. Sleep when your infant sleeps. Ask friends and family for help if you need time to nap or rest.   Do not smoke. Smoking can lower your milk supply and harm your infant. If you need help quitting, ask your caregiver for a program recommendation.   Ask your caregiver about birth control options. Birth control pills may lower your milk supply. You may be advised to use condoms or other forms of birth control.  Relax and pump Stimulating your  let-down reflex is the key to successful and effective pumping. This makes the milk in all parts of the breast flow more freely.   It is easier to pump breast milk (and breastfeed) while you are relaxed. Find techniques that work for you. Quiet private spaces, breast massage, soothing heat placed on the breast, music, and pictures or a tape recording of your infant may help you to relax and "let down" your milk. If you have difficulty with your let down, try smelling one of your infant's blankets or an item of clothing he or she has worn while you are pumping.   When pumping, place the special suction cup (flange) directly over the nipple. It may be uncomfortable and cause nipple damage if it is not placed properly or is the wrong size. Applying a small amount of purified or modified lanolin to your nipple and the areola may help increase your comfort level. Also, you can change the speed and suction of many electric pumps to your comfort level. Your caregiver or lactation consultant can help you with this.   If pumping continues to be painful, or you feel you are not getting very much milk when you pump, you may need a different type of pump. A lactation consultant can help you determine if this is the case.   If you are with your infant, feed him or her on demand and try pumping after each feeding. This will boost your production, even if milk   does not come out. You may not be able to pump much milk at first, but keep up the routine, and this will change.   If you are working or away from your infant for several hours, try pumping for about 15 minutes every 2 to 3 hours. Pump both breasts at the same time if you can.   If your infant has a formula feeding, make sure you pump your milk around the same time to maintain your supply.   Begin pumping breast milk a few weeks before you return to work. This will help you develop techniques that work for you and will be able to store extra milk.   Find a  source of breastfeeding information that works well for you.  TIPS FOR STORING BREAST MILK  Store breast milk in a sealable sterile bag, jar, or container provided with your pumping supplies.   Store milk in small amounts close to what your infant is drinking at each feeding.   Cool pumped milk in a refrigerator or cooler. Pumped milk can last at the back of the refrigerator for 3 to 8 days.   Place cooled milk at the back of the freezer for up to 3 months.   Thaw the milk in its container or bag in warm water up to 24 hours in advance. Do not use a microwave to thaw or heat milk. Do not refreeze the milk after it has been thawed.   Breast milk is safe to drink when left at room temperature (mid 70s or colder) for 4 to 8 hours. After that, throw it away.   Milk fat can separate and look funny. The color can vary slightly from day to day. This is normal. Always shake the milk before using it to mix the fat with the more watery portion.  SEEK MEDICAL CARE IF:   You are having trouble pumping or feeding your infant.   You are concerned that you are not making enough milk.   You have nipple pain, soreness, or redness.   You have other questions or concerns related to you or your infant.  Document Released: 08/08/2009 Document Revised: 02/07/2011 Document Reviewed: 08/08/2009 Exit[employer] Patient Information 2012 [employer]. 

## 2011-05-17 NOTE — Progress Notes (Signed)
Late Entry from 05/15/11  Clinical Social Work Department BRIEF PSYCHOSOCIAL ASSESSMENT 05/17/2011 Patient:  [name],[name]     Account Number:  [id number]     Admit date:  05/14/2011   Clinical Social Worker:  Brisha Mccabe, LCSWA  Date/Time:  05/15/2011 03:00 PM  Referred by:  Physician  Date Referred:  05/15/2011 Referred for  Behavioral Health Issues   Other Referral:   History of depression   Interview type:  Patient Other interview type:    PSYCHOSOCIAL DATA Living Status:  PARENTS Admitted from facility:   Level of care:   Primary support name:     Lauren [name] Primary support relationship to patient:   Spouse Degree of support available:   Involved    CURRENT CONCERNS Current Concerns  Behavioral Health Issues   Other Concerns:    SOCIAL WORK ASSESSMENT / PLAN  Sw spoke with pt briefly to assess history of depression.  Pt told Sw that depression has not been an issue for her in a while.  She reports being able to cope well and having good support.  PP depression discussed with pt by Sw and BSN RN, yesterday evening.    Assessment/plan status:  No Further Intervention Required Other assessment/ plan:   Information/referral to community resources:    PATIENT'S/FAMILY'S RESPONSE TO PLAN OF CARE:  Pt thanked Sw for consult/resources offered.  

## 2011-05-17 NOTE — Discharge Summary (Signed)
Obstetric Discharge Summary Reason for Admission: onset of labor and rupture of membranes, Gestational hypertension, velamentous cord insertion Prenatal Procedures: NST, ultrasound Intrapartum Procedures: spontaneous vaginal delivery and curettage Postpartum Procedures: curettage, antibiotics and TDaP vaccine Complications-Operative and Postpartum: 2nd  Degree perineal and bilateral vulva laceration Hemoglobin  Date Value Range Status  05/16/2011 9.5* 12.0-15.0 (g/dL) Final     DELTA CHECK NOTED     REPEATED TO VERIFY     HCT  Date Value Range Status  05/16/2011 29.4* 36.0-46.0 (%) Final    Physical Exam:  General: alert, cooperative and no distress Lochia: appropriate Uterine Fundus: firm Incision: healing well DVT Evaluation: Negative Homan's sign. No cords or calf tenderness. Calf/Ankle edema is present.  Discharge Diagnoses: Term Pregnancy-delivered and postpartum hypertension well controlled on Labetalol and HCTZ  Discharge Information: Date: 05/17/2011 Activity: pelvic rest Diet: routine Medications: PNV, Ibuprofen, Colace, Percocet and Labetalol and HCTZ Condition: stable Instructions: refer to practice specific booklet Discharge to: home Follow-up Information    Follow up with Marlinda Mike, CNM in 1 week.   Contact information:   [address] [place] [address] [phone]          Newborn Data: Live born female "Lauren [name]" Birth Weight: 7 lb 2 oz (3232 g) APGAR: 8, 9  Home with mother.  Lauren [name] 05/17/2011, 9:46 AM

## 2011-05-17 NOTE — Progress Notes (Signed)
PPD 2 SVD  S:  Reports feeling well, better since delivery, denies PEC s/s.             Tolerating po/ No nausea or vomiting             Bleeding is light             Pain controlled with Motrin and Percocet             Up ad lib / ambulatory  Newborn  Information for the patient's newborn:  Tyreesha, Maharaj [[id number]]  female   breast feeding  improved O:  A & O x 3 NAD, generalized edema             VS:  Filed Vitals:   05/16/11 0500 05/16/11 1425 05/16/11 2017 05/17/11 0525  BP: 136/92 138/69 110/71 106/67  Pulse: 97 90 68 76  Temp: 98.3 F (36.8 C) 98.7 F (37.1 C) 97.5 F (36.4 C) 98.3 F (36.8 C)  TempSrc:  Oral Oral Oral  Resp: 20 20 20 20   Height:      Weight:      SpO2:        LABS:   Basename 05/16/11 0540 05/14/11 1037  WBC 14.7* 14.0*  HGB 9.5* 12.2  HCT 29.4* 37.5  PLT 306 293     Abdomen: soft, non-tender, non-distended              Fundus: firm, non-tender, U-2, vigorous fundal massage w/o clots  Perineum: bruised, repair intact, mild edema  Lochia: small  Extremities:  +2 pedal and pretibial edema, no calf pain or tenderness, neg Homans    A/P: PPD # 1 31 y.o., [id number] SVD 3/13, D&E for retained placenta  Gestational HTN no evidence PEC  On HCTZ for edema, BP stable on Labetalol 100 mg PO BID, will continue for 1 week    Doing well - stable status  Routine post partum orders  D/C home  F/U OV in 1 week  Lactation consult ongoing, will give Rx for pump d/t difficulty latching   Lauren [name], CNM, MSN 05/17/2011, 9:39 AM

## 2011-05-21 ENCOUNTER — Inpatient Hospital Stay (HOSPITAL_COMMUNITY): Admission: RE | Admit: 2011-05-21 | Payer: No Typology Code available for payment source | Source: Ambulatory Visit

## 2011-05-24 ENCOUNTER — Encounter (HOSPITAL_COMMUNITY): Payer: No Typology Code available for payment source

## 2011-05-25 ENCOUNTER — Encounter (HOSPITAL_COMMUNITY): Payer: No Typology Code available for payment source

## 2011-08-02 ENCOUNTER — Other Ambulatory Visit: Payer: Self-pay | Admitting: Obstetrics and Gynecology

## 2011-08-02 DIAGNOSIS — N6320 Unspecified lump in the left breast, unspecified quadrant: Secondary | ICD-10-CM

## 2011-08-07 ENCOUNTER — Ambulatory Visit
Admission: RE | Admit: 2011-08-07 | Discharge: 2011-08-07 | Disposition: A | Discharge status: Home / Self Care | Payer: Managed Care, Other (non HMO) | Source: Ambulatory Visit | Attending: Obstetrics and Gynecology | Admitting: Obstetrics and Gynecology

## 2011-08-07 ENCOUNTER — Other Ambulatory Visit: Payer: No Typology Code available for payment source

## 2011-08-07 DIAGNOSIS — N6320 Unspecified lump in the left breast, unspecified quadrant: Secondary | ICD-10-CM

## 2013-03-04 NOTE — L&D Delivery Note (Signed)
Delivery Note  First Stage: Labor onset: 1732 after induction with cervical balloon earlier in day Augmentation : pitocin and AROM Analgesia /Anesthesia intrapartum: nubain 5 mg IV x 1 AROM at 2053  Second Stage: Complete dilation at 0416 Onset of pushing at 0420 FHR second stage category 2-3 - audible decels to 80s with return to baseline 130 - single episode of fetal tachycardia 160-70 briefly prior to birth     Difficulty maintaining EFM due to maternal pain - pushing monitor off abdomen  Delivery of a viable female at 25 by CNM in LOA position Loose nuchal cord x 1 reduced down body at birth Cord double clamped after cessation of pulsation, cut by FOB Cord blood sample collected   Third Stage: Placenta delivered manually - appears intact but friable with 3 VC @ 0440 Placenta disposition: pathology Uterine tone firm after placenta removal / bleeding brisk immediately after newborn birth with placental separation / placenta in cervical os with brisk bleeding Manual removal indicated - resolution of brisk bleeding with placental removal Cytotec per rectum after repair for continued hemostasis  Right labial laceration / left side wall vaginal in proximal sulcus / second degree laceration along previous laceration - repair site Anesthesia for repair: 1% xylocaine local Repair 3-0 vicryl deep interrupted for perineal muscle repair/ 3-0 vicryl for vaginal laceration repair / subcuticular repair of labia and perineal skin Est. Blood Loss (mL): 500  Complications: placental separation with brisk bleeding necessitating manual removal of placenta for hemostasis  Mom to postpartum.  Baby to Couplet care / Skin to Skin.  Newborn: Birth Weight: 6-5  Apgar Scores: 9-9 Feeding planned: breast  Marlinda Mike CNM, MSN, FACNM 11/06/2013, 5:29 AM

## 2013-05-20 LAB — OB RESULTS CONSOLE ABO/RH: RH Type: POSITIVE

## 2013-05-20 LAB — OB RESULTS CONSOLE GC/CHLAMYDIA
CHLAMYDIA, DNA PROBE: NEGATIVE
GC PROBE AMP, GENITAL: NEGATIVE

## 2013-05-20 LAB — OB RESULTS CONSOLE RUBELLA ANTIBODY, IGM: RUBELLA: NON-IMMUNE/NOT IMMUNE

## 2013-05-20 LAB — OB RESULTS CONSOLE ANTIBODY SCREEN: ANTIBODY SCREEN: NEGATIVE

## 2013-05-20 LAB — OB RESULTS CONSOLE HEPATITIS B SURFACE ANTIGEN: HEP B S AG: NEGATIVE

## 2013-05-20 LAB — OB RESULTS CONSOLE HIV ANTIBODY (ROUTINE TESTING): HIV: NONREACTIVE

## 2013-05-20 LAB — OB RESULTS CONSOLE RPR: RPR: NONREACTIVE

## 2013-10-29 LAB — OB RESULTS CONSOLE GBS: GBS: NEGATIVE

## 2013-11-03 ENCOUNTER — Encounter (HOSPITAL_COMMUNITY): Payer: Self-pay | Admitting: *Deleted

## 2013-11-03 ENCOUNTER — Telehealth (HOSPITAL_COMMUNITY): Payer: Self-pay | Admitting: *Deleted

## 2013-11-03 NOTE — Telephone Encounter (Signed)
Preadmission screen  

## 2013-11-05 ENCOUNTER — Inpatient Hospital Stay (HOSPITAL_COMMUNITY)
Admission: RE | Admit: 2013-11-05 | Discharge: 2013-11-05 | Disposition: A | Discharge status: Home / Self Care | Payer: Managed Care, Other (non HMO) | Source: Ambulatory Visit | Attending: Obstetrics & Gynecology | Admitting: Obstetrics & Gynecology

## 2013-11-05 ENCOUNTER — Encounter (HOSPITAL_COMMUNITY): Payer: Self-pay | Admitting: *Deleted

## 2013-11-05 ENCOUNTER — Inpatient Hospital Stay (HOSPITAL_COMMUNITY)
Admission: AD | Admit: 2013-11-05 | Discharge: 2013-11-07 | DRG: 774 | Disposition: A | Discharge status: Home / Self Care | Payer: Managed Care, Other (non HMO) | Source: Ambulatory Visit | Attending: Obstetrics & Gynecology | Admitting: Obstetrics & Gynecology

## 2013-11-05 DIAGNOSIS — O4590 Premature separation of placenta, unspecified, unspecified trimester: Secondary | ICD-10-CM | POA: Diagnosis present

## 2013-11-05 DIAGNOSIS — K831 Obstruction of bile duct: Secondary | ICD-10-CM | POA: Diagnosis present

## 2013-11-05 DIAGNOSIS — F341 Dysthymic disorder: Secondary | ICD-10-CM | POA: Diagnosis present

## 2013-11-05 DIAGNOSIS — Z803 Family history of malignant neoplasm of breast: Secondary | ICD-10-CM | POA: Diagnosis not present

## 2013-11-05 DIAGNOSIS — K838 Other specified diseases of biliary tract: Secondary | ICD-10-CM | POA: Diagnosis present

## 2013-11-05 DIAGNOSIS — O26899 Other specified pregnancy related conditions, unspecified trimester: Secondary | ICD-10-CM | POA: Diagnosis present

## 2013-11-05 DIAGNOSIS — J45909 Unspecified asthma, uncomplicated: Secondary | ICD-10-CM | POA: Diagnosis present

## 2013-11-05 DIAGNOSIS — O99344 Other mental disorders complicating childbirth: Secondary | ICD-10-CM | POA: Diagnosis present

## 2013-11-05 DIAGNOSIS — Z87891 Personal history of nicotine dependence: Secondary | ICD-10-CM | POA: Diagnosis not present

## 2013-11-05 DIAGNOSIS — K219 Gastro-esophageal reflux disease without esophagitis: Secondary | ICD-10-CM | POA: Diagnosis present

## 2013-11-05 DIAGNOSIS — Z8249 Family history of ischemic heart disease and other diseases of the circulatory system: Secondary | ICD-10-CM

## 2013-11-05 DIAGNOSIS — Z806 Family history of leukemia: Secondary | ICD-10-CM | POA: Diagnosis not present

## 2013-11-05 DIAGNOSIS — O26619 Liver and biliary tract disorders in pregnancy, unspecified trimester: Secondary | ICD-10-CM | POA: Diagnosis present

## 2013-11-05 LAB — CBC
HCT: 38.8 % (ref 36.0–46.0)
Hemoglobin: 13.4 g/dL (ref 12.0–15.0)
MCH: 30.6 pg (ref 26.0–34.0)
MCHC: 34.5 g/dL (ref 30.0–36.0)
MCV: 88.6 fL (ref 78.0–100.0)
Platelets: 212 10*3/uL (ref 150–400)
RBC: 4.38 MIL/uL (ref 3.87–5.11)
RDW: 14.7 % (ref 11.5–15.5)
WBC: 10.8 10*3/uL — ABNORMAL HIGH (ref 4.0–10.5)

## 2013-11-05 MED ORDER — ALBUTEROL SULFATE (2.5 MG/3ML) 0.083% IN NEBU
2.5000 mg | INHALATION_SOLUTION | RESPIRATORY_TRACT | Status: DC | PRN
  Administered 2013-11-05: 2.5 mg via RESPIRATORY_TRACT
  Filled 2013-11-05: qty 3

## 2013-11-05 MED ORDER — CITRIC ACID-SODIUM CITRATE 334-500 MG/5ML PO SOLN: 30.0000 mL | ORAL | Status: DC | PRN

## 2013-11-05 MED ORDER — LIDOCAINE HCL (PF) 1 % IJ SOLN
30.0000 mL | INTRAMUSCULAR | Status: DC | PRN
  Administered 2013-11-06: 30 mL via SUBCUTANEOUS
  Filled 2013-11-05: qty 30

## 2013-11-05 MED ORDER — LACTATED RINGERS IV SOLN: 500.0000 mL | INTRAVENOUS | Status: DC | PRN

## 2013-11-05 MED ORDER — FAMOTIDINE 20 MG PO TABS
20.0000 mg | ORAL_TABLET | Freq: Two times a day (BID) | ORAL | Status: DC
  Administered 2013-11-05 – 2013-11-07 (×4): 20 mg via ORAL
  Filled 2013-11-05 (×4): qty 1

## 2013-11-05 MED ORDER — OXYTOCIN BOLUS FROM INFUSION
500.0000 mL | INTRAVENOUS | Status: DC
  Administered 2013-11-06: 500 mL via INTRAVENOUS

## 2013-11-05 MED ORDER — OXYCODONE-ACETAMINOPHEN 5-325 MG PO TABS: 1.0000 | ORAL_TABLET | ORAL | Status: DC | PRN

## 2013-11-05 MED ORDER — OXYTOCIN 40 UNITS IN LACTATED RINGERS INFUSION - SIMPLE MED
62.5000 mL/h | INTRAVENOUS | Status: DC
  Administered 2013-11-06: 62.5 mL/h via INTRAVENOUS

## 2013-11-05 MED ORDER — URSODIOL 300 MG PO CAPS
300.0000 mg | ORAL_CAPSULE | Freq: Three times a day (TID) | ORAL | Status: DC
  Administered 2013-11-05 (×2): 300 mg via ORAL
  Filled 2013-11-05 (×4): qty 1

## 2013-11-05 MED ORDER — ACETAMINOPHEN 325 MG PO TABS: 650.0000 mg | ORAL_TABLET | ORAL | Status: DC | PRN

## 2013-11-05 MED ORDER — MISOPROSTOL 25 MCG QUARTER TABLET
25.0000 ug | ORAL_TABLET | Freq: Once | ORAL | Status: DC
  Filled 2013-11-05: qty 0.25

## 2013-11-05 MED ORDER — LACTATED RINGERS IV SOLN
INTRAVENOUS | Status: DC
  Administered 2013-11-05: 17:00:00 via INTRAVENOUS

## 2013-11-05 MED ORDER — OXYTOCIN 40 UNITS IN LACTATED RINGERS INFUSION - SIMPLE MED
1.0000 m[IU]/min | INTRAVENOUS | Status: DC
  Administered 2013-11-05: 8 m[IU]/min via INTRAVENOUS
  Administered 2013-11-05: 1 m[IU]/min via INTRAVENOUS
  Administered 2013-11-05: 2 m[IU]/min via INTRAVENOUS
  Filled 2013-11-05: qty 1000

## 2013-11-05 MED ORDER — OXYCODONE-ACETAMINOPHEN 5-325 MG PO TABS: 2.0000 | ORAL_TABLET | ORAL | Status: DC | PRN

## 2013-11-05 NOTE — H&P (Signed)
OB ADMISSION/ HISTORY & PHYSICAL:  Admission Date: 11/05/2013  7:40 AM  Admit Diagnosis: 37.1 weeks with cholestasis of pregnancy  Lauren [name] is a 33 y.o. female presenting for induction of labor.  Prenatal History: [id number]   EDC : 11/25/2013, by Other Basis  Prenatal care at [facility]  Primary Ob Provider: Marlinda Mike CNM Prenatal course complicated by asthma / GERD / poor weight gain / cholestasis of pregnancy  Prenatal Labs: ABO, Rh: O/Positive/-- (03/19 0000) Antibody: Negative (03/19 0000) Rubella: Nonimmune (03/19 0000)  RPR: Nonreactive (03/19 0000)  HBsAg: Negative (03/19 0000)  HIV: Non-reactive (03/19 0000)  GTT: NL GBS: Negative (08/28 0000)   Medical / Surgical History :  Past medical history:  Past Medical History  Diagnosis Date  . Anxiety   . Depression   . GERD (gastroesophageal reflux disease)   . Female infertility associated with anovulation   . Family history of malignant neoplasm of breast   . History of chicken pox   . Other specified diseases of blood and blood-forming organs(289.89)     pseudocholinesterase deficiency  . Active labor 05/14/2011  . Velamentous insertion of umbilical cord 05/14/2011    with last pregnancy  . Vaginal Pap smear, abnormal   . Complication of anesthesia     states allergic to general anesthesia  . Complication of anesthesia     inability to break down certain anesthetic meds  . Asthma     uses fast acting inhaler during allergy season     Past surgical history:  Past Surgical History  Procedure Laterality Date  . Cholecystectomy      2011  . Colonoscopy    . Induced abortion    . Dilation and evacuation  05/15/2011    Procedure: DILATATION AND EVACUATION;  Surgeon: Genia Del, MD;  Location: [facility];  Service: Gynecology;  Laterality: N/A;  repair of bilateral  vulva tear, and second degree laceration    Family History:  Family History  Problem Relation Age of Onset  .  Hypertension Mother   . Immunodeficiency Father   . Cancer Maternal Grandmother     leukemia  . Anesthesia problems Neg Hx      Social History:  reports that she has quit smoking. She has never used smokeless tobacco. She reports that she does not drink alcohol or use illicit drugs.   Allergies: Other; Penicillins; and Ritalin    Current Medications at time of admission:  Prior to Admission medications   Medication Sig Start Date End Date Taking? Authorizing Provider  Misc. Devices (BREAST PUMP) MISC 1 Units by Does not apply route as needed. 05/17/11   Arlan Organ, CNM  omeprazole (PRILOSEC OTC) 20 MG tablet Take 20 mg by mouth every morning.     Historical Provider, MD  Prenatal Vit-Fe Fumarate-FA (PRENATAL MULTIVITAMIN) TABS Take 1 tablet by mouth every morning.     Historical Provider, MD  Probiotic Product (PROBIOTIC FORMULA PO) Take 1 capsule by mouth every morning.     Historical Provider, MD  pyridOXINE (VITAMIN B-6) 50 MG tablet Take 50 mg by mouth every morning.     Historical Provider, MD   Review of Systems: Active FM Intermittent mild itching - mostly at night now  Physical Exam:  VS: Height  (1.575 m), weight 91.627 kg (202 lb), unknown if currently breastfeeding.  General: alert and oriented, appears NAD Heart: RRR Lungs: Clear lung fields Abdomen: Gravid, soft and non-tender, non-distended / uterus: gravid Extremities:  no edema  Genitalia / VE:   loose 1cm /60% / vtx / -2  FHR: baseline rate 125 / variability moderate / accelerations + / no decelerations TOCO: no ctx or UI  Assessment: 37.[redacted] weeks gestation Induction of labor due to cholestasis of pregnancy FHR category 1   Plan:  Cervical balloon - addition of cytotec in 2 hours / traction to balloon every 2 hr AROM after balloon out versus pitocin - per ctx pattern  Dr Juliene Pina notified of admission / plan of care   Marlinda Mike CNM, MSN, [facility] 11/05/2013, 9:35 AM

## 2013-11-05 NOTE — Progress Notes (Signed)
S:  No ctx - ambulating ad lib      Some small cramping  O:  VS: Blood pressure 131/73, pulse 87, temperature 98.4 F (36.9 C), temperature source Oral, resp. rate 20, height  (1.575 m), weight 91.627 kg (202 lb), unknown if currently breastfeeding.        FHR : baseline 140 / variability moderate / accelerations + / no decelerations      A: induction of labor     FHR category 1  P:  Cytotec per vagina now      IV start      Ok light lunch   Marlinda Mike CNM, MSN, [facility] 11/05/2013, 12:35 PM

## 2013-11-05 NOTE — Progress Notes (Signed)
S:  Not really feeling any ctx - intermittent backache or cramps only  O:  VS: Blood pressure 105/64, pulse 74, temperature 98.4 F (36.9 C), temperature source Oral, resp. rate 20, height  (1.575 m), weight 91.627 kg (202 lb), unknown if currently breastfeeding.        FHR : baseline 125 / variability moderate / accelerations + / no decelerations        Toco: UI        Cervix : 4.5 / 80%/ vtx / -1  / balloon out with last BRP        Membranes: intact  A: induction of  labor     FHR category 1  P: Discussed pitocin versus AROM without regular ctx        recommend pitocin to establish adequate labor pattern then AROM when ctx regular    Susa Loffler, MSN, [facility] 11/05/2013, 4:21 PM

## 2013-11-05 NOTE — Progress Notes (Signed)
S:  Ready for IOL      Active FM - no itching at present  O:  VS: Height  (1.575 m), weight 91.627 kg (202 lb), unknown if currently breastfeeding.        FHR : baseline 130 / variability mod / accelerations + / no decelerations        Cervix : 1-2cm / 60% / vtx -2 mid position        Membranes: intact        Cervical balloon placed without difficulty  A: induction labor     FHR category 1  P:  Cervical balloon for induction      Addition of Cytotec in 2-3 hours if no regular ctx with balloon      AROM      Pitocin as indicated for maintenance of active labor management   Marlinda Mike CNM, MSN, [facility] 11/05/2013, 9:32 AM

## 2013-11-06 ENCOUNTER — Encounter (HOSPITAL_COMMUNITY): Payer: Self-pay | Admitting: *Deleted

## 2013-11-06 LAB — RPR

## 2013-11-06 MED ORDER — BENZOCAINE-MENTHOL 20-0.5 % EX AERO
1.0000 "application " | INHALATION_SPRAY | CUTANEOUS | Status: DC | PRN
  Administered 2013-11-06 – 2013-11-07 (×2): 1 via TOPICAL
  Filled 2013-11-06 (×2): qty 56

## 2013-11-06 MED ORDER — DIBUCAINE 1 % RE OINT: 1.0000 "application " | TOPICAL_OINTMENT | RECTAL | Status: DC | PRN

## 2013-11-06 MED ORDER — BUPROPION HCL ER (XL) 150 MG PO TB24
150.0000 mg | ORAL_TABLET | Freq: Every morning | ORAL | Status: DC
  Administered 2013-11-06 – 2013-11-07 (×2): 150 mg via ORAL
  Filled 2013-11-06 (×2): qty 1

## 2013-11-06 MED ORDER — SIMETHICONE 80 MG PO CHEW: 80.0000 mg | CHEWABLE_TABLET | ORAL | Status: DC | PRN

## 2013-11-06 MED ORDER — OXYCODONE-ACETAMINOPHEN 5-325 MG PO TABS: 2.0000 | ORAL_TABLET | ORAL | Status: DC | PRN

## 2013-11-06 MED ORDER — NALBUPHINE HCL 10 MG/ML IJ SOLN
5.0000 mg | Freq: Once | INTRAMUSCULAR | Status: AC
  Administered 2013-11-06: 5 mg via INTRAVENOUS
  Filled 2013-11-06: qty 1

## 2013-11-06 MED ORDER — WITCH HAZEL-GLYCERIN EX PADS
1.0000 "application " | MEDICATED_PAD | CUTANEOUS | Status: DC | PRN
  Administered 2013-11-06: 1 via TOPICAL

## 2013-11-06 MED ORDER — IBUPROFEN 600 MG PO TABS
600.0000 mg | ORAL_TABLET | Freq: Four times a day (QID) | ORAL | Status: DC
  Administered 2013-11-06 – 2013-11-07 (×5): 600 mg via ORAL
  Filled 2013-11-06 (×5): qty 1

## 2013-11-06 MED ORDER — HYDROCORTISONE 2.5 % RE CREA
1.0000 "application " | TOPICAL_CREAM | Freq: Four times a day (QID) | RECTAL | Status: DC
  Filled 2013-11-06 (×36): qty 28.35

## 2013-11-06 MED ORDER — METHYLERGONOVINE MALEATE 0.2 MG/ML IJ SOLN
INTRAMUSCULAR | Status: AC
  Filled 2013-11-06: qty 1

## 2013-11-06 MED ORDER — HYDROCORTISONE ACE-PRAMOXINE 2.5-1 % RE CREA
TOPICAL_CREAM | Freq: Four times a day (QID) | RECTAL | Status: DC
  Filled 2013-11-06: qty 30

## 2013-11-06 MED ORDER — HYDROCORTISONE 2.5 % RE CREA
1.0000 "application " | TOPICAL_CREAM | Freq: Four times a day (QID) | RECTAL | Status: DC
  Administered 2013-11-06 – 2013-11-07 (×5): 1 via RECTAL
  Filled 2013-11-06: qty 28.35

## 2013-11-06 MED ORDER — NALOXONE HCL 0.4 MG/ML IJ SOLN
INTRAMUSCULAR | Status: AC
  Filled 2013-11-06: qty 1

## 2013-11-06 MED ORDER — SENNOSIDES-DOCUSATE SODIUM 8.6-50 MG PO TABS
2.0000 | ORAL_TABLET | ORAL | Status: DC
  Administered 2013-11-06: 2 via ORAL
  Filled 2013-11-06: qty 2

## 2013-11-06 MED ORDER — DIPHENHYDRAMINE HCL 25 MG PO CAPS: 25.0000 mg | ORAL_CAPSULE | Freq: Four times a day (QID) | ORAL | Status: DC | PRN

## 2013-11-06 MED ORDER — OXYCODONE-ACETAMINOPHEN 5-325 MG PO TABS
1.0000 | ORAL_TABLET | ORAL | Status: DC | PRN
  Administered 2013-11-06 – 2013-11-07 (×2): 1 via ORAL
  Filled 2013-11-06 (×2): qty 1

## 2013-11-06 MED ORDER — CARBOPROST TROMETHAMINE 250 MCG/ML IM SOLN
INTRAMUSCULAR | Status: AC
  Filled 2013-11-06: qty 1

## 2013-11-06 MED ORDER — LANOLIN HYDROUS EX OINT: TOPICAL_OINTMENT | CUTANEOUS | Status: DC | PRN

## 2013-11-06 MED ORDER — MISOPROSTOL 200 MCG PO TABS
ORAL_TABLET | ORAL | Status: AC
  Administered 2013-11-06: 800 ug
  Filled 2013-11-06: qty 5

## 2013-11-06 NOTE — Lactation Note (Signed)
This note was copied from the chart of Lauren Magdalena Skilton. Lactation Consultation Note  MBU RN requests a nipple shield per mom's request.  Mom reports needing one with an older child.  MBU RN fitted for a #20 and mom is feeling better about feedings after baby latch on well.  Mom to call for assist as needed.   Patient Name: Lauren [name] [id number] Date: 11/06/2013 Reason for consult: Initial assessment   Maternal Data Formula Feeding for Exclusion: No Does the patient have breastfeeding experience prior to this delivery?: Yes  Feeding Feeding Type: Breast Fed Length of feed: 0 min  LATCH Score/Interventions                      Lactation Tools Discussed/Used Nipple shield size: 20 Initiated by:: Anette Guarneri Date initiated:: 11/06/13   Consult Status Consult Status: Follow-up Date: 11/07/13 Follow-up type: In-patient    Maciel Kegg, Arvella Merles 11/06/2013, 5:06 PM

## 2013-11-06 NOTE — Lactation Note (Signed)
This note was copied from the chart of Lauren [name]. Lactation Consultation Note: Initial visit with this experienced BF mom. Baby now 9 hours old and born at [address]. Baby has nuzzled at the breast and mom dropper fed Colostrum to baby earlier with [facility] assistance.  Mom finger fed her first baby until she finally latched at [address]. Very committed to breastfeeding. Mom was able to pump about 20 cc's of Colostrum earlier with hand pump. Baby asleep skin to skni with mom. No questions at present. BF brochure given with resources for support after DC. To call prn  Patient Name: Lauren [name] [id number] Date: 11/06/2013 Reason for consult: Initial assessment   Maternal Data Formula Feeding for Exclusion: No Does the patient have breastfeeding experience prior to this delivery?: Yes  Feeding    LATCH Score/Interventions                      Lactation Tools Discussed/Used Initiated by:: Anette Guarneri Date initiated:: 11/06/13   Consult Status Consult Status: Follow-up Date: 11/07/13 Follow-up type: In-patient    Pamelia Hoit 11/06/2013, 2:30 PM

## 2013-11-06 NOTE — Progress Notes (Signed)
S:  Ctx painful - some pressure       Breathing with ctx and relaxed between ctx / activity ad lib       Spouse direct labor support  O:  VS: Blood pressure 108/58, pulse 71, temperature 97.6 F (36.4 C), temperature source Oral, resp. rate 18, height  (1.575 m), weight 91.627 kg (202 lb), unknown if currently breastfeeding.        FHR : baseline 125 / variability moderate / accelerations + / no decelerations        Toco: contractions every 2-3 minutes / pitocin 10 mu/min         Cervix : 5cm / 90% / vtx -1        Membranes: clear fluid - small pink tinged  A: active labor     FHR category 1  P: expectant management      Anticipate SVB in next 2-3 hours  Marlinda Mike CNM, MSN, [facility] 11/06/2013, 5:14 AM

## 2013-11-07 LAB — COMPREHENSIVE METABOLIC PANEL
ALT: 18 U/L (ref 0–35)
AST: 17 U/L (ref 0–37)
Albumin: 2.2 g/dL — ABNORMAL LOW (ref 3.5–5.2)
Alkaline Phosphatase: 120 U/L — ABNORMAL HIGH (ref 39–117)
Anion gap: 11 (ref 5–15)
BUN: 7 mg/dL (ref 6–23)
CO2: 24 mEq/L (ref 19–32)
Calcium: 8.6 mg/dL (ref 8.4–10.5)
Chloride: 107 mEq/L (ref 96–112)
Creatinine, Ser: 0.73 mg/dL (ref 0.50–1.10)
GFR calc Af Amer: >90 mL/min (ref >90)
GFR calc non Af Amer: >90 mL/min (ref >90)
Glucose, Bld: 85 mg/dL (ref 70–99)
Potassium: 4.4 mEq/L (ref 3.7–5.3)
Sodium: 142 mEq/L (ref 137–147)
Total Bilirubin: <0.2 mg/dL — ABNORMAL LOW (ref 0.3–1.2)
Total Protein: 4.7 g/dL — ABNORMAL LOW (ref 6.0–8.3)

## 2013-11-07 LAB — CBC
HCT: 37 % (ref 36.0–46.0)
Hemoglobin: 12.7 g/dL (ref 12.0–15.0)
MCH: 31 pg (ref 26.0–34.0)
MCHC: 34.3 g/dL (ref 30.0–36.0)
MCV: 90.2 fL (ref 78.0–100.0)
Platelets: 190 10*3/uL (ref 150–400)
RBC: 4.1 MIL/uL (ref 3.87–5.11)
RDW: 15 % (ref 11.5–15.5)
WBC: 10 10*3/uL (ref 4.0–10.5)

## 2013-11-07 MED ORDER — IBUPROFEN 600 MG PO TABS: 600.0000 mg | ORAL_TABLET | Freq: Four times a day (QID) | ORAL | Status: DC

## 2013-11-07 MED ORDER — BUPROPION HCL ER (XL) 150 MG PO TB24: 150.0000 mg | ORAL_TABLET | Freq: Every morning | ORAL | Status: DC

## 2013-11-07 MED ORDER — HYDROCORTISONE 2.5 % RE CREA: 1.0000 "application " | TOPICAL_CREAM | Freq: Four times a day (QID) | RECTAL | Status: DC

## 2013-11-07 MED ORDER — OXYCODONE-ACETAMINOPHEN 5-325 MG PO TABS: 1.0000 | ORAL_TABLET | ORAL | Status: DC | PRN

## 2013-11-07 NOTE — Plan of Care (Signed)
Problem: Discharge Progression Outcomes Goal: MMR given as ordered Outcome: Not Applicable Date Met:  34/96/11 Pt refused at this time.

## 2013-11-07 NOTE — Progress Notes (Signed)
Patient ID: Lauren [name], female   DOB: [birth year], 33 y.o.   MRN: [id number] PPD # 1 SVD  S:  Reports feeling well             Tolerating po/ No nausea or vomiting             Bleeding is light             Pain controlled with ibuprofen (OTC)             Up ad lib / ambulatory / voiding without difficulties    Newborn  Information for the patient's newborn:  Laysa, Kimmey [[id number]]  female  breast feeding     O:  A & O x 3, in no apparent distress              VS:  Filed Vitals:   11/06/13 0745 11/06/13 1215 11/06/13 1758 11/07/13 0600  BP: 105/55 96/48 116/62 108/72  Pulse: 68 66 70 63  Temp:  98.9 F (37.2 C) 98.2 F (36.8 C) 98.4 F (36.9 C)  TempSrc:  Oral Oral   Resp: Height:      Weight:      SpO2:        LABS:  Recent Labs  11/05/13 1700 11/07/13 0614  WBC 10.8* 10.0  HGB 13.4 12.7  HCT 38.8 37.0  PLT 212 190    Blood type: O/Positive/-- (03/19 0000)  Rubella: Nonimmune (03/19 0000)   I&O: I/O last 3 completed shifts: In: -  Out: 500 [Blood:500]             Lungs: Clear and unlabored  Heart: regular rate and rhythm / no murmurs  Abdomen: soft, non-tender, non-distended              Fundus: firm, non-tender, U-2  Perineum: 2nd degree repair healing well - mild edema, no ice pack  Lochia: minimal  Extremities: trace edema, no calf pain or tenderness, no Homans    A/P: PPD # 1  33 y.o., [id number]   Principal Problem:   Postpartum care following vaginal delivery (9/5)   Cholestasis of pregnancy - improving / no symptoms since delivery      Doing well - stable status  Routine post partum orders  Anticipate discharge tomorrow    Raelyn Mora, M, MSN, CNM 11/07/2013, 8:57 AM

## 2013-11-07 NOTE — Discharge Summary (Signed)
Obstetric Discharge Summary  Reason for Admission: Pt is a [id number] at [redacted]w[redacted]d with cholestasis of pregnancy  Patient has received care at [facility] since 13 wks, with Marlinda Mike, CNM as primary provider.  Medications on Admission: Prescriptions prior to admission  Medication Sig Dispense Refill  . albuterol (PROVENTIL HFA;VENTOLIN HFA) 108 (90 BASE) MCG/ACT inhaler Inhale 1-2 puffs into the lungs every 6 (six) hours as needed for wheezing or shortness of breath.      . famotidine-calcium carbonate-magnesium hydroxide (PEPCID COMPLETE) 10-800-165 MG CHEW chewable tablet Chew 1 tablet by mouth at bedtime.      . hydrocortisone-pramoxine (PROCTOFOAM-HC) rectal foam Place 1 applicator rectally at bedtime as needed for hemorrhoids or itching.      Marland Kitchen omeprazole (PRILOSEC OTC) 20 MG tablet Take 20 mg by mouth every morning.       . Prenatal Vit-Fe Fumarate-FA (PRENATAL MULTIVITAMIN) TABS Take 1 tablet by mouth every morning.       . Probiotic Product (PROBIOTIC FORMULA PO) Take 1 capsule by mouth every morning.       . ursodiol (ACTIGALL) 300 MG capsule Take 300 mg by mouth 3 (three) times daily.        Prenatal Labs: ABO, Rh: O/Positive/-- (03/19 0000)  Antibody: Negative (03/19 0000) Rubella: Nonimmune (03/19 0000)   RPR: NON REAC (09/04 1700)  HBsAg: Negative (03/19 0000)  HIV: Non-reactive (03/19 0000)  GTT : normal GBS: Negative (08/28 0000)   Prenatal Procedures: NST and ultrasound Intrapartum Procedures: spontaneous vaginal delivery Postpartum Procedures: none Complications-Operative and Postpartum: Right labial laceration / left side wall vaginal in proximal sulcus / second degree laceration along previous laceration , manual removal of placenta  Labs: Hemoglobin  Date Value Ref Range Status  11/07/2013 12.7  12.0 - 15.0 g/dL Final     HCT  Date Value Ref Range Status  11/07/2013 37.0  36.0 - 46.0 % Final   Lab Results  Component Value Date   PLT 190 11/07/2013     Newborn Data: Live born female  Birth Weight: 6 lb 5.2 oz (2870 g) APGAR: 9, 9  Home with mother.   Discharge Information: Date: 11/07/2013 Discharge Diagnoses:  Pt is a [id number] at [redacted]w[redacted]d S/P Term Pregnancy-delivered on 11/06/2013  Condition: stable Activity: pelvic rest Diet: routine Medications:    Medication List         albuterol 108 (90 BASE) MCG/ACT inhaler  Commonly known as:  PROVENTIL HFA;VENTOLIN HFA  Inhale 1-2 puffs into the lungs every 6 (six) hours as needed for wheezing or shortness of breath.     famotidine-calcium carbonate-magnesium hydroxide 10-800-165 MG Chew chewable tablet  Commonly known as:  PEPCID COMPLETE  Chew 1 tablet by mouth at bedtime.     hydrocortisone 2.5 % rectal cream  Commonly known as:  ANUSOL-HC  Place 1 application rectally 4 (four) times daily.     hydrocortisone-pramoxine rectal foam  Commonly known as:  PROCTOFOAM-HC  Place 1 applicator rectally at bedtime as needed for hemorrhoids or itching.     ibuprofen 600 MG tablet  Commonly known as:  ADVIL,MOTRIN  Take 1 tablet (600 mg total) by mouth every 6 (six) hours.     omeprazole 20 MG tablet  Commonly known as:  PRILOSEC OTC  Take 20 mg by mouth every morning.     oxyCODONE-acetaminophen 5-325 MG per tablet  Commonly known as:  PERCOCET/ROXICET  Take 1 tablet by mouth every 4 (four) hours as needed (for pain scale less than  7).     prenatal multivitamin Tabs tablet  Take 1 tablet by mouth every morning.     PROBIOTIC FORMULA PO  Take 1 capsule by mouth every morning.     ursodiol 300 MG capsule  Commonly known as:  ACTIGALL  Take 300 mg by mouth 3 (three) times daily.       Instructions: [facility] instruction booklet has been given and reviewed Discharge to: home     Follow-up Information   Follow up with Marlinda Mike, CNM. Schedule an appointment as soon as possible for a visit in 6 weeks. (postpartum visit)    Specialty:  Obstetrics and  Gynecology   Contact information:   [address] [place] [address] [phone]       Raelyn Mora, Judie Petit, MSN, CNM 11/07/2013, 12:09 PM

## 2013-11-07 NOTE — Discharge Instructions (Signed)
Breast Pumping Tips °If you are breastfeeding, there may be times when you cannot feed your baby directly. Returning to work or going on a trip are common examples. Pumping allows you to store breast milk and feed it to your baby later.  °You may not get much milk when you first start to pump. Your breasts should start to make more after a few days. If you pump at the times you usually feed your baby, you may be able to keep making enough milk to feed your baby without also using formula. The more often you pump, the more milk you will produce.  °WHEN SHOULD I PUMP?  °· You can begin to pump soon after delivery. However, some experts recommend waiting about 4 weeks before giving your infant a bottle to make sure breastfeeding is going well.  °· If you plan to return to work, begin pumping a few weeks before. This will help you develop techniques that work best for you. It also lets you build up a supply of breast milk.   °· When you are with your infant, feed on demand and pump after each feeding.   °· When you are away from your infant for several hours, pump for about 15 minutes every 2-3 hours. Pump both breasts at the same time if you can.   °· If your infant has a formula feeding, make sure to pump around the same time.     °· If you drink any alcohol, wait 2 hours before pumping.   °HOW DO I PREPARE TO PUMP? °Your let-down reflex is the natural reaction to stimulation that makes your breast milk flow. It is easier to stimulate this reflex when you are relaxed. Find relaxation techniques that work for you. If you have difficulty with your let-down reflex, try these methods:  °· Smell one of your infant's blankets or an item of clothing.   °· Look at a picture or video of your infant.   °· Sit in a quiet, private space.   °· Massage the breast you plan to pump.   °· Place soothing warmth on the breast.   °· Play relaxing music.   °WHAT ARE SOME GENERAL BREAST PUMPING TIPS? °· Wash your hands before you pump. You  do not need to wash your nipples or breasts. °· There are three ways to pump. °¨ You can use your hand to massage and compress your breast. °¨ You can use a handheld manual pump. °¨ You can use an electric pump.   °· Make sure the suction cup (flange) on the breast pump is the right size. Place the flange directly over the nipple. If it is the wrong size or placed the wrong way, it may be painful and cause nipple damage.   °· If pumping is uncomfortable, apply a small amount of purified or modified lanolin to your nipple and areola. °· If you are using an electric pump, adjust the speed and suction power to be more comfortable. °· If pumping is painful or if you are not getting very much milk, you may need a different type of pump. A lactation consultant can help you determine what type of pump to use.   °· Keep a full water bottle near you at all times. Drinking lots of fluid helps you make more milk.  °· You can store your milk to use later. Pumped breast milk can be stored in a sealable, sterile container or plastic bag. Label all stored breast milk with the date you pumped it. °¨ Milk can stay out at room temperature for up to 8 hours. °¨   You can store your milk in the refrigerator for up to 8 days. °¨ You can store your milk in the freezer for 3 months. Thaw frozen milk using warm water. Do not put it in the microwave. °· Do not smoke. Smoking can lower your milk supply and harm your infant. If you need help quitting, ask your health care provider to recommend a program.   °WHEN SHOULD I CALL MY HEALTH CARE PROVIDER OR A LACTATION CONSULTANT? °· You are having trouble pumping. °· You are concerned that you are not making enough milk. °· You have nipple pain, soreness, or redness. °· You want to use birth control. Birth control pills may lower your milk supply. Talk to your health care provider about your options. °Document Released: 08/08/2009 Document Revised: 02/23/2013 Document Reviewed:  12/11/2012 °Exit[employer]® Patient Information ©2015 [employer]. This information is not intended to replace advice given to you by your health care provider. Make sure you discuss any questions you have with your health care provider. ° °Nutrition for the New Mother  °A new mother needs good health and nutrition so she can have energy to take care of a new baby. Whether a mother breastfeeds or formula feeds the baby, it is important to have a well-balanced diet. Foods from all the food groups should be chosen to meet the new mother's energy needs and to give her the nutrients needed for repair and healing.  °A HEALTHY EATING PLAN °The My [employer] plan for Moms outlines what you should eat to help you and your baby stay healthy. The energy and amount of food you need depends on whether or not you are breastfeeding. If you are breastfeeding you will need more nutrients. If you choose not to breastfeed, your nutrition goal should be to return to a healthy weight. Limiting calories may be needed if you are not breastfeeding.  °HOME CARE INSTRUCTIONS  °· For a personal plan based on your unique needs, see your Registered Dietitian or visit www.mypyramid.gov. °· Eat a variety of foods. The plan below will help guide you. The following chart has a suggested daily meal plan from the [employer] for Moms. °· Eat a variety of fruits and vegetables. °· Eat more dark green and orange vegetables and cooked dried beans. °· Make half your grains whole grains. Choose whole instead of refined grains. °· Choose low-fat or lean meats and poultry. °· Choose low-fat or fat-free dairy products like milk, cheese, or yogurt. °Fruits °· Breastfeeding: 2 cups °· Non-Breastfeeding: 2 cups °· What Counts as a serving? °¨ 1 cup of fruit or juice. °¨ ½ cup dried fruit. °Vegetables °· Breastfeeding: 3 cups °· Non-Breastfeeding: 2 ½ cups °· What Counts as a serving? °¨ 1 cup raw or cooked vegetables. °¨ Juice or 2 cups raw leafy  vegetables. °Grains °· Breastfeeding: 8 oz °· Non-Breastfeeding: 6 oz °· What Counts as a serving? °¨ 1 slice bread. °¨ 1 oz ready-to-eat cereal. °¨ ½ cup cooked pasta, rice, or cereal. °Meat and Beans °· Breastfeeding: 6 ½ oz °· Non-Breastfeeding: 5 ½ oz °· What Counts as a serving? °¨ 1 oz lean meat, poultry, or fish °¨ ¼ cup cooked dry beans °¨ ½ oz nuts or 1 egg °¨ 1 tbs peanut butter °Milk °· Breastfeeding: 3 cups °· Non-Breastfeeding: 3 cups °· What Counts as a serving? °¨ 1 cup milk. °¨ 8 oz yogurt. °¨ 1 ½ oz cheese. °¨ 2 oz processed cheese. °TIPS FOR THE BREASTFEEDING MOM °· Rapid weight   loss is not suggested when you are breastfeeding. By simply breastfeeding, you will be able to lose the weight gained during your pregnancy. Your caregiver can keep track of your weight and tell you if your weight loss is appropriate. °· Be sure to drink fluids. You may notice that you are thirstier than usual. A suggestion is to drink a glass of water or other beverage whenever you breastfeed. °· Avoid alcohol as it can be passed into your breast milk. °· Limit caffeine drinks to no more than 2 to 3 cups per day. °· You may need to keep taking your prenatal vitamin while you are breastfeeding. Talk with your caregiver about taking a vitamin or supplement. °RETURING TO A HEALTHY WEIGHT °· The My [employer] Plan for Moms will help you return to a healthy weight. It will also provide the nutrients you need. °· You may need to limit "empty" calories. These include: °¨ High fat foods like fried foods, fatty meats, fast food, butter, and mayonnaise. °¨ High sugar foods like sodas, jelly, candy, and sweets. °· Be physically active. Include 30 minutes of exercise or more each day. Choose an activity you like such as walking, swimming, biking, or aerobics. Check with your caregiver before you start to exercise. °Document Released: 05/28/2007 Document Revised: 05/13/2011 Document Reviewed: 05/28/2007 °ExitCare® Patient Information  ©2015 [employer]. This information is not intended to replace advice given to you by your health care provider. Make sure you discuss any questions you have with your health care provider. °Postpartum Depression and Baby Blues °The postpartum period begins right after the birth of a baby. During this time, there is often a great amount of joy and excitement. It is also a time of many changes in the life of the parents. Regardless of how many times a mother gives birth, each child brings new challenges and dynamics to the family. It is not unusual to have feelings of excitement along with confusing shifts in moods, emotions, and thoughts. All mothers are at risk of developing postpartum depression or the "baby blues." These mood changes can occur right after giving birth, or they may occur many months after giving birth. The baby blues or postpartum depression can be mild or severe. Additionally, postpartum depression can go away rather quickly, or it can be a long-term condition.  °CAUSES °Raised hormone levels and the rapid drop in those levels are thought to be a main cause of postpartum depression and the baby blues. A number of hormones change during and after pregnancy. Estrogen and progesterone usually decrease right after the delivery of your baby. The levels of thyroid hormone and various cortisol steroids also rapidly drop. Other factors that play a role in these mood changes include major life events and genetics.  °RISK FACTORS °If you have any of the following risks for the baby blues or postpartum depression, know what symptoms to watch out for during the postpartum period. Risk factors that may increase the likelihood of getting the baby blues or postpartum depression include: °· Having a personal or family history of depression.   °· Having depression while being pregnant.   °· Having premenstrual mood issues or mood issues related to oral contraceptives. °· Having a lot of life stress.   °· Having  marital conflict.   °· Lacking a social support network.   °· Having a baby with special needs.   °· Having health problems, such as diabetes.   °SIGNS AND SYMPTOMS °Symptoms of baby blues include: °· Brief changes in mood, such as going   from extreme happiness to sadness. °· Decreased concentration.   °· Difficulty sleeping.   °· Crying spells, tearfulness.   °· Irritability.   °· Anxiety.   °Symptoms of postpartum depression typically begin within the first month after giving birth. These symptoms include: °· Difficulty sleeping or excessive sleepiness.   °· Marked weight loss.   °· Agitation.   °· Feelings of worthlessness.   °· Lack of interest in activity or food.   °Postpartum psychosis is a very serious condition and can be dangerous. Fortunately, it is rare. Displaying any of the following symptoms is cause for immediate medical attention. Symptoms of postpartum psychosis include:  °· Hallucinations and delusions.   °· Bizarre or disorganized behavior.   °· Confusion or disorientation.   °DIAGNOSIS  °A diagnosis is made by an evaluation of your symptoms. There are no medical or lab tests that lead to a diagnosis, but there are various questionnaires that a health care provider may use to identify those with the baby blues, postpartum depression, or psychosis. Often, a screening tool called the Edinburgh Postnatal Depression Scale is used to diagnose depression in the postpartum period.  °TREATMENT °The baby blues usually goes away on its own in 1-2 weeks. Social support is often all that is needed. You will be encouraged to get adequate sleep and rest. Occasionally, you may be given medicines to help you sleep.  °Postpartum depression requires treatment because it can last several months or longer if it is not treated. Treatment may include individual or group therapy, medicine, or both to address any social, physiological, and psychological factors that may play a role in the depression. Regular exercise, a  healthy diet, rest, and social support may also be strongly recommended.  °Postpartum psychosis is more serious and needs treatment right away. Hospitalization is often needed. °HOME CARE INSTRUCTIONS °· Get as much rest as you can. Nap when the baby sleeps.   °· Exercise regularly. Some women find yoga and walking to be beneficial.   °· Eat a balanced and nourishing diet.   °· Do little things that you enjoy. Have a cup of tea, take a bubble bath, read your favorite magazine, or listen to your favorite music. °· Avoid alcohol.   °· Ask for help with household chores, cooking, grocery shopping, or running errands as needed. Do not try to do everything.   °· Talk to people close to you about how you are feeling. Get support from your partner, family members, friends, or other new moms. °· Try to stay positive in how you think. Think about the things you are grateful for.   °· Do not spend a lot of time alone.   °· Only take over-the-counter or prescription medicine as directed by your health care provider. °· Keep all your postpartum appointments.   °· Let your health care provider know if you have any concerns.   °SEEK MEDICAL CARE IF: °You are having a reaction to or problems with your medicine. °SEEK IMMEDIATE MEDICAL CARE IF: °· You have suicidal feelings.   °· You think you may harm the baby or someone else. °MAKE SURE YOU: °· Understand these instructions. °· Will watch your condition. °· Will get help right away if you are not doing well or get worse. °Document Released: 11/23/2003 Document Revised: 02/23/2013 Document Reviewed: 11/30/2012 °Exit[employer]® Patient Information ©2015 [employer]. This information is not intended to replace advice given to you by your health care provider. Make sure you discuss any questions you have with your health care provider. °Breastfeeding and Mastitis °Mastitis is inflammation of the breast tissue. It can occur in women who   are breastfeeding. This can make breastfeeding  painful. Mastitis will sometimes go away on its own. Your health care provider will help determine if treatment is needed. °CAUSES °Mastitis is often associated with a blocked milk (lactiferous) duct. This can happen when too much milk builds up in the breast. Causes of excess milk in the breast can include: °· Poor latch-on. If your baby is not latched onto the breast properly, she or he may not empty your breast completely while breastfeeding. °· Allowing too much time to pass between feedings. °· Wearing a bra or other clothing that is too tight. This puts extra pressure on the lactiferous ducts so milk does not flow through them as it should. °Mastitis can also be caused by a bacterial infection. Bacteria may enter the breast tissue through cuts or openings in the skin. In women who are breastfeeding, this may occur because of cracked or irritated skin. Cracks in the skin are often caused when your baby does not latch on properly to the breast. °SIGNS AND SYMPTOMS °· Swelling, redness, tenderness, and pain in an area of the breast. °· Swelling of the glands under the arm on the same side. °· Fever may or may not accompany mastitis. °If an infection is allowed to progress, a collection of pus (abscess) may develop. °DIAGNOSIS  °Your health care provider can usually diagnose mastitis based on your symptoms and a physical exam. Tests may be done to help confirm the diagnosis. These may include: °· Removal of pus from the breast by applying pressure to the area. This pus can be examined in the lab to determine which bacteria are present. If an abscess has developed, the fluid in the abscess can be removed with a needle. This can also be used to confirm the diagnosis and determine the bacteria present. In most cases, pus will not be present. °· Blood tests to determine if your body is fighting a bacterial infection. °· Mammogram or ultrasound tests to rule out other problems or diseases. °TREATMENT  °Mastitis that  occurs with breastfeeding will sometimes go away on its own. Your health care provider may choose to wait 24 hours after first seeing you to decide whether a prescription medicine is needed. If your symptoms are worse after 24 hours, your health care provider will likely prescribe an antibiotic medicine to treat the mastitis. He or she will determine which bacteria are most likely causing the infection and will then select an appropriate antibiotic medicine. This is sometimes changed based on the results of tests performed to identify the bacteria, or if there is no response to the antibiotic medicine selected. Antibiotic medicines are usually given by mouth. You may also be given medicine for pain. °HOME CARE INSTRUCTIONS °· Only take over-the-counter or prescription medicines for pain, fever, or discomfort as directed by your health care provider. °· If your health care provider prescribed an antibiotic medicine, take the medicine as directed. Make sure you finish it even if you start to feel better. °· Do not wear a tight or underwire bra. Wear a soft, supportive bra. °· Increase your fluid intake, especially if you have a fever. °· Continue to empty the breast. Your health care provider can tell you whether this milk is safe for your infant or needs to be thrown out. You may be told to stop nursing until your health care provider thinks it is safe for your baby. Use a breast pump if you are advised to stop nursing. °· Keep your nipples   clean and dry. °· Empty the first breast completely before going to the other breast. If your baby is not emptying your breasts completely for some reason, use a breast pump to empty your breasts. °· If you go back to work, pump your breasts while at work to stay in time with your nursing schedule. °· Avoid allowing your breasts to become overly filled with milk (engorged). °SEEK MEDICAL CARE IF: °· You have pus-like discharge from the breast. °· Your symptoms do not improve with  the treatment prescribed by your health care provider within 2 days. °SEEK IMMEDIATE MEDICAL CARE IF: °· Your pain and swelling are getting worse. °· You have pain that is not controlled with medicine. °· You have a red line extending from the breast toward your armpit. °· You have a fever or persistent symptoms for more than 2-3 days. °· You have a fever and your symptoms suddenly get worse. °MAKE SURE YOU:  °· Understand these instructions. °· Will watch your condition. °· Will get help right away if you are not doing well or get worse. °Document Released: 06/15/2004 Document Revised: 02/23/2013 Document Reviewed: 09/24/2012 °[employer]® Patient Information ©2015 [employer]. This information is not intended to replace advice given to you by your health care provider. Make sure you discuss any questions you have with your health care provider. °Breastfeeding °Deciding to breastfeed is one of the best choices you can make for you and your baby. A change in hormones during pregnancy causes your breast tissue to grow and increases the number and size of your milk ducts. These hormones also allow proteins, sugars, and fats from your blood supply to make breast milk in your milk-producing glands. Hormones prevent breast milk from being released before your baby is born as well as prompt milk flow after birth. Once breastfeeding has begun, thoughts of your baby, as well as his or her sucking or crying, can stimulate the release of milk from your milk-producing glands.  °BENEFITS OF BREASTFEEDING °For Your Baby °· Your first milk (colostrum) helps your baby's digestive system function better.   °· There are antibodies in your milk that help your baby fight off infections.   °· Your baby has a lower incidence of asthma, allergies, and sudden infant death syndrome.   °· The nutrients in breast milk are better for your baby than infant formulas and are designed uniquely for your baby's needs.   °· Breast milk improves your  baby's brain development.   °· Your baby is less likely to develop other conditions, such as childhood obesity, asthma, or type 2 diabetes mellitus.   °For You  °· Breastfeeding helps to create a very special bond between you and your baby.   °· Breastfeeding is convenient. Breast milk is always available at the correct temperature and costs nothing.   °· Breastfeeding helps to burn calories and helps you lose the weight gained during pregnancy.   °· Breastfeeding makes your uterus contract to its prepregnancy size faster and slows bleeding (lochia) after you give birth.   °· Breastfeeding helps to lower your risk of developing type 2 diabetes mellitus, osteoporosis, and breast or ovarian cancer later in life. °SIGNS THAT YOUR BABY IS HUNGRY °Early Signs of Hunger  °· Increased alertness or activity. °· Stretching. °· Movement of the head from side to side. °· Movement of the head and opening of the mouth when the corner of the mouth or cheek is stroked (rooting). °· Increased sucking sounds, smacking lips, cooing, sighing, or squeaking. °· Hand-to-mouth movements. °· Increased sucking of   fingers or hands. °Late Signs of Hunger °· Fussing. °· Intermittent crying. °Extreme Signs of Hunger °Signs of extreme hunger will require calming and consoling before your baby will be able to breastfeed successfully. Do not wait for the following signs of extreme hunger to occur before you initiate breastfeeding:   °· Restlessness. °· A loud, strong cry. °·  Screaming. °BREASTFEEDING BASICS °Breastfeeding Initiation °· Find a comfortable place to sit or lie down, with your neck and back well supported. °· Place a pillow or rolled up blanket under your baby to bring him or her to the level of your breast (if you are seated). Nursing pillows are specially designed to help support your arms and your baby while you breastfeed. °· Make sure that your baby's abdomen is facing your abdomen.   °· Gently massage your breast. With your  fingertips, massage from your chest wall toward your nipple in a circular motion. This encourages milk flow. You may need to continue this action during the feeding if your milk flows slowly. °· Support your breast with 4 fingers underneath and your thumb above your nipple. Make sure your fingers are well away from your nipple and your baby's mouth.   °· Stroke your baby's lips gently with your finger or nipple.   °· When your baby's mouth is open wide enough, quickly bring your baby to your breast, placing your entire nipple and as much of the colored area around your nipple (areola) as possible into your baby's mouth.   °¨ More areola should be visible above your baby's upper lip than below the lower lip.   °¨ Your baby's tongue should be between his or her lower gum and your breast.   °· Ensure that your baby's mouth is correctly positioned around your nipple (latched). Your baby's lips should create a seal on your breast and be turned out (everted). °· It is common for your baby to suck about 2-3 minutes in order to start the flow of breast milk. °Latching °Teaching your baby how to latch on to your breast properly is very important. An improper latch can cause nipple pain and decreased milk supply for you and poor weight gain in your baby. Also, if your baby is not latched onto your nipple properly, he or she may swallow some air during feeding. This can make your baby fussy. Burping your baby when you switch breasts during the feeding can help to get rid of the air. However, teaching your baby to latch on properly is still the best way to prevent fussiness from swallowing air while breastfeeding. °Signs that your baby has successfully latched on to your nipple:    °· Silent tugging or silent sucking, without causing you pain.   °· Swallowing heard between every 3-4 sucks.   °·  Muscle movement above and in front of his or her ears while sucking.   °Signs that your baby has not successfully latched on to  nipple:  °· Sucking sounds or smacking sounds from your baby while breastfeeding. °· Nipple pain. °If you think your baby has not latched on correctly, slip your finger into the corner of your baby's mouth to break the suction and place it between your baby's gums. Attempt breastfeeding initiation again. °Signs of Successful Breastfeeding °Signs from your baby:   °· A gradual decrease in the number of sucks or complete cessation of sucking.   °· Falling asleep.   °· Relaxation of his or her body.   °· Retention of a small amount of milk in his or her mouth.   °· Letting go   of your breast by himself or herself. °Signs from you: °· Breasts that have increased in firmness, weight, and size 1-3 hours after feeding.   °· Breasts that are softer immediately after breastfeeding. °· Increased milk volume, as well as a change in milk consistency and color by the fifth day of breastfeeding.   °· Nipples that are not sore, cracked, or bleeding. °Signs That Your Baby is Getting Enough Milk °· Wetting at least 3 diapers in a 24-hour period. The urine should be clear and pale yellow by age 5 days. °· At least 3 stools in a 24-hour period by age 5 days. The stool should be soft and yellow. °· At least 3 stools in a 24-hour period by age 7 days. The stool should be seedy and yellow. °· No loss of weight greater than 10% of birth weight during the first 3 days of age. °· Average weight gain of 4-7 ounces (113-198 g) per week after age 4 days. °· Consistent daily weight gain by age 5 days, without weight loss after the age of 2 weeks. °After a feeding, your baby may spit up a small amount. This is common. °BREASTFEEDING FREQUENCY AND DURATION °Frequent feeding will help you make more milk and can prevent sore nipples and breast engorgement. Breastfeed when you feel the need to reduce the fullness of your breasts or when your baby shows signs of hunger. This is called "breastfeeding on demand." Avoid introducing a pacifier to your  baby while you are working to establish breastfeeding (the first 4-6 weeks after your baby is born). After this time you may choose to use a pacifier. Research has shown that pacifier use during the first year of a baby's life decreases the risk of sudden infant death syndrome (SIDS). °Allow your baby to feed on each breast as long as he or she wants. Breastfeed until your baby is finished feeding. When your baby unlatches or falls asleep while feeding from the first breast, offer the second breast. Because newborns are often sleepy in the first few weeks of life, you may need to awaken your baby to get him or her to feed. °Breastfeeding times will vary from baby to baby. However, the following rules can serve as a guide to help you ensure that your baby is properly fed: °· Newborns (babies 4 weeks of age or younger) may breastfeed every 1-3 hours. °· Newborns should not go longer than 3 hours during the day or 5 hours during the night without breastfeeding. °· You should breastfeed your baby a minimum of 8 times in a 24-hour period until you begin to introduce solid foods to your baby at around 6 months of age. °BREAST MILK PUMPING °Pumping and storing breast milk allows you to ensure that your baby is exclusively fed your breast milk, even at times when you are unable to breastfeed. This is especially important if you are going back to work while you are still breastfeeding or when you are not able to be present during feedings. Your lactation consultant can give you guidelines on how long it is safe to store breast milk.  °A breast pump is a machine that allows you to pump milk from your breast into a sterile bottle. The pumped breast milk can then be stored in a refrigerator or freezer. Some breast pumps are operated by hand, while others use electricity. Ask your lactation consultant which type will work best for you. Breast pumps can be purchased, but some hospitals and breastfeeding support groups   lease  breast pumps on a monthly basis. A lactation consultant can teach you how to hand express breast milk, if you prefer not to use a pump.  °CARING FOR YOUR BREASTS WHILE YOU BREASTFEED °Nipples can become dry, cracked, and sore while breastfeeding. The following recommendations can help keep your breasts moisturized and healthy: °· Avoid using soap on your nipples.   °· Wear a supportive bra. Although not required, special nursing bras and tank tops are designed to allow access to your breasts for breastfeeding without taking off your entire bra or top. Avoid wearing underwire-style bras or extremely tight bras. °· Air dry your nipples for 3-4 minutes after each feeding.   °· Use only cotton bra pads to absorb leaked breast milk. Leaking of breast milk between feedings is normal.   °· Use lanolin on your nipples after breastfeeding. Lanolin helps to maintain your skin's normal moisture barrier. If you use pure lanolin, you do not need to wash it off before feeding your baby again. Pure lanolin is not toxic to your baby. You may also hand express a few drops of breast milk and gently massage that milk into your nipples and allow the milk to air dry. °In the first few weeks after giving birth, some women experience extremely full breasts (engorgement). Engorgement can make your breasts feel heavy, warm, and tender to the touch. Engorgement peaks within 3-5 days after you give birth. The following recommendations can help ease engorgement: °· Completely empty your breasts while breastfeeding or pumping. You may want to start by applying warm, moist heat (in the shower or with warm water-soaked hand towels) just before feeding or pumping. This increases circulation and helps the milk flow. If your baby does not completely empty your breasts while breastfeeding, pump any extra milk after he or she is finished. °· Wear a snug bra (nursing or regular) or tank top for 1-2 days to signal your body to slightly decrease milk  production. °· Apply ice packs to your breasts, unless this is too uncomfortable for you. °· Make sure that your baby is latched on and positioned properly while breastfeeding. °If engorgement persists after 48 hours of following these recommendations, contact your health care provider or a lactation consultant. °OVERALL HEALTH CARE RECOMMENDATIONS WHILE BREASTFEEDING °· Eat healthy foods. Alternate between meals and snacks, eating 3 of each per day. Because what you eat affects your breast milk, some of the foods may make your baby more irritable than usual. Avoid eating these foods if you are sure that they are negatively affecting your baby. °· Drink milk, fruit juice, and water to satisfy your thirst (about 10 glasses a day).   °· Rest often, relax, and continue to take your prenatal vitamins to prevent fatigue, stress, and anemia. °· Continue breast self-awareness checks. °· Avoid chewing and smoking tobacco. °· Avoid alcohol and drug use. °Some medicines that may be harmful to your baby can pass through breast milk. It is important to ask your health care provider before taking any medicine, including all over-the-counter and prescription medicine as well as vitamin and herbal supplements. °It is possible to become pregnant while breastfeeding. If birth control is desired, ask your health care provider about options that will be safe for your baby. °SEEK MEDICAL CARE IF:  °· You feel like you want to stop breastfeeding or have become frustrated with breastfeeding. °· You have painful breasts or nipples. °· Your nipples are cracked or bleeding. °· Your breasts are red, tender, or warm. °· You have   a swollen area on either breast. °· You have a fever or chills. °· You have nausea or vomiting. °· You have drainage other than breast milk from your nipples. °· Your breasts do not become full before feedings by the fifth day after you give birth. °· You feel sad and depressed. °· Your baby is too sleepy to eat  well. °· Your baby is having trouble sleeping.   °· Your baby is wetting less than 3 diapers in a 24-hour period. °· Your baby has less than 3 stools in a 24-hour period. °· Your baby's skin or the white part of his or her eyes becomes yellow.   °· Your baby is not gaining weight by 5 days of age. °SEEK IMMEDIATE MEDICAL CARE IF:  °· Your baby is overly tired (lethargic) and does not want to wake up and feed. °· Your baby develops an unexplained fever. °Document Released: 02/18/2005 Document Revised: 02/23/2013 Document Reviewed: 08/12/2012 °Exit[employer]® Patient Information ©2015 [employer]. This information is not intended to replace advice given to you by your health care provider. Make sure you discuss any questions you have with your health care provider. ° °

## 2014-01-03 ENCOUNTER — Encounter (HOSPITAL_COMMUNITY): Payer: Self-pay | Admitting: *Deleted

## 2014-08-28 ENCOUNTER — Emergency Department (HOSPITAL_BASED_OUTPATIENT_CLINIC_OR_DEPARTMENT_OTHER): Payer: BLUE CROSS/BLUE SHIELD

## 2014-08-28 ENCOUNTER — Encounter (HOSPITAL_BASED_OUTPATIENT_CLINIC_OR_DEPARTMENT_OTHER): Payer: Self-pay | Admitting: Emergency Medicine

## 2014-08-28 ENCOUNTER — Emergency Department (HOSPITAL_BASED_OUTPATIENT_CLINIC_OR_DEPARTMENT_OTHER)
Admission: EM | Admit: 2014-08-28 | Discharge: 2014-08-28 | Disposition: A | Discharge status: Home / Self Care | Payer: BLUE CROSS/BLUE SHIELD | Attending: Emergency Medicine | Admitting: Emergency Medicine

## 2014-08-28 DIAGNOSIS — Z862 Personal history of diseases of the blood and blood-forming organs and certain disorders involving the immune mechanism: Secondary | ICD-10-CM | POA: Insufficient documentation

## 2014-08-28 DIAGNOSIS — W273XXA Contact with needle (sewing), initial encounter: Secondary | ICD-10-CM | POA: Diagnosis not present

## 2014-08-28 DIAGNOSIS — Y998 Other external cause status: Secondary | ICD-10-CM | POA: Diagnosis not present

## 2014-08-28 DIAGNOSIS — S61241A Puncture wound with foreign body of left index finger without damage to nail, initial encounter: Secondary | ICD-10-CM | POA: Diagnosis not present

## 2014-08-28 DIAGNOSIS — S61239A Puncture wound without foreign body of unspecified finger without damage to nail, initial encounter: Secondary | ICD-10-CM

## 2014-08-28 DIAGNOSIS — Z880 Allergy status to penicillin: Secondary | ICD-10-CM | POA: Diagnosis not present

## 2014-08-28 DIAGNOSIS — J45909 Unspecified asthma, uncomplicated: Secondary | ICD-10-CM | POA: Insufficient documentation

## 2014-08-28 DIAGNOSIS — K219 Gastro-esophageal reflux disease without esophagitis: Secondary | ICD-10-CM | POA: Insufficient documentation

## 2014-08-28 DIAGNOSIS — M795 Residual foreign body in soft tissue: Secondary | ICD-10-CM

## 2014-08-28 DIAGNOSIS — Z8742 Personal history of other diseases of the female genital tract: Secondary | ICD-10-CM | POA: Diagnosis not present

## 2014-08-28 DIAGNOSIS — F329 Major depressive disorder, single episode, unspecified: Secondary | ICD-10-CM | POA: Insufficient documentation

## 2014-08-28 DIAGNOSIS — Y93D2 Activity, sewing: Secondary | ICD-10-CM | POA: Insufficient documentation

## 2014-08-28 DIAGNOSIS — Z87891 Personal history of nicotine dependence: Secondary | ICD-10-CM | POA: Insufficient documentation

## 2014-08-28 DIAGNOSIS — S6992XA Unspecified injury of left wrist, hand and finger(s), initial encounter: Secondary | ICD-10-CM | POA: Diagnosis present

## 2014-08-28 DIAGNOSIS — Z8619 Personal history of other infectious and parasitic diseases: Secondary | ICD-10-CM | POA: Diagnosis not present

## 2014-08-28 DIAGNOSIS — Y9289 Other specified places as the place of occurrence of the external cause: Secondary | ICD-10-CM | POA: Diagnosis not present

## 2014-08-28 DIAGNOSIS — F419 Anxiety disorder, unspecified: Secondary | ICD-10-CM | POA: Insufficient documentation

## 2014-08-28 DIAGNOSIS — Z79899 Other long term (current) drug therapy: Secondary | ICD-10-CM | POA: Insufficient documentation

## 2014-08-28 DIAGNOSIS — Z7952 Long term (current) use of systemic steroids: Secondary | ICD-10-CM | POA: Diagnosis not present

## 2014-08-28 MED ORDER — IBUPROFEN 400 MG PO TABS
400.0000 mg | ORAL_TABLET | Freq: Once | ORAL | Status: AC
  Administered 2014-08-28: 400 mg via ORAL
  Filled 2014-08-28: qty 1

## 2014-08-28 MED ORDER — LIDOCAINE HCL (PF) 1 % IJ SOLN
5.0000 mL | Freq: Once | INTRAMUSCULAR | Status: AC
  Administered 2014-08-28: 5 mL
  Filled 2014-08-28: qty 5

## 2014-08-28 MED ORDER — CEPHALEXIN 500 MG PO CAPS: 500.0000 mg | ORAL_CAPSULE | Freq: Four times a day (QID) | ORAL | Status: DC

## 2014-08-28 MED ORDER — CEPHALEXIN 250 MG PO CAPS
500.0000 mg | ORAL_CAPSULE | Freq: Once | ORAL | Status: AC
  Administered 2014-08-28: 500 mg via ORAL
  Filled 2014-08-28: qty 2

## 2014-08-28 MED ORDER — BACITRACIN 500 UNIT/GM EX OINT
1.0000 "application " | TOPICAL_OINTMENT | Freq: Two times a day (BID) | CUTANEOUS | Status: DC
  Filled 2014-08-28: qty 0.9

## 2014-08-28 MED ORDER — TETANUS-DIPHTH-ACELL PERTUSSIS 5-2.5-18.5 LF-MCG/0.5 IM SUSP
0.5000 mL | Freq: Once | INTRAMUSCULAR | Status: DC
  Filled 2014-08-28 (×2): qty 0.5

## 2014-08-28 NOTE — ED Notes (Signed)
Pt given ice pack for comfort.  

## 2014-08-28 NOTE — ED Notes (Signed)
Pt in with sewing needle through her L index finger. States she was sewing and accidentally threaded the needle into and through her finger, needle broke through the nail. Pt did not want to remove the needle without seeking care.

## 2014-08-28 NOTE — Discharge Instructions (Signed)
Wash the affected area with soap and water and apply a thin layer of topical antibiotic ointment. Do this every 12 hours.  ° °Do not use rubbing alcohol or hydrogen peroxide.                       ° °Look for signs of infection: if you see redness, if the area becomes warm, if pain increases sharply, there is discharge (pus), if red streaks appear or you develop fever or vomiting, RETURN immediately to the Emergency Department  for a recheck.  ° °

## 2014-08-28 NOTE — ED Provider Notes (Signed)
CSN: [id number]     Arrival date & time 08/28/14  1711 History   First MD Initiated Contact with Patient 08/28/14 1837     Chief Complaint  Patient presents with  . Finger Injury     (Consider location/radiation/quality/duration/timing/severity/associated sxs/prior Treatment) HPI   Blood pressure 117/76, pulse 68, temperature 98.3 F (36.8 C), temperature source Oral, resp. rate 16, height 5\' 2"  (1.575 m), weight 172 lb (78.019 kg), last menstrual period 08/03/2014, SpO2 100 %, unknown if currently breastfeeding.  Lauren [name] is a 34 y.o. female complaining of foreign body to left 2nd digit. Patient states that she was sewing with her sewing machine, she was distracted by her children and the needle from the sewing machine went through the nail. She states that her last tetanus shot was within the last 5-6 years. She rates her pain as moderate, 6 out of 10, exacerbated by movement and palpation and described as stinging and burning. No pain medication taken prior to arrival.  Past Medical History  Diagnosis Date  . Anxiety   . Depression   . GERD (gastroesophageal reflux disease)   . Female infertility associated with anovulation   . Family history of malignant neoplasm of breast   . History of chicken pox   . Other specified diseases of blood and blood-forming organs(289.89)     pseudocholinesterase deficiency  . Active labor 05/14/2011  . Velamentous insertion of umbilical cord 05/14/2011    with last pregnancy  . Vaginal Pap smear, abnormal   . Complication of anesthesia     states allergic to general anesthesia  . Complication of anesthesia     inability to break down certain anesthetic meds  . Asthma     uses fast acting inhaler during allergy season   Past Surgical History  Procedure Laterality Date  . Cholecystectomy      2011  . Colonoscopy    . Induced abortion    . Dilation and evacuation  05/15/2011    Procedure: DILATATION AND EVACUATION;  Surgeon: Genia Del, MD;  Location: [facility];  Service: Gynecology;  Laterality: N/A;  repair of bilateral  vulva tear, and second degree laceration   Family History  Problem Relation Age of Onset  . Hypertension Mother   . Immunodeficiency Father   . Cancer Maternal Grandmother     leukemia  . Anesthesia problems Neg Hx    History  Substance Use Topics  . Smoking status: Former Games developer  . Smokeless tobacco: Never Used     Comment: quit 9 yrs ago  . Alcohol Use: No   OB History    Gravida Para Term Preterm AB TAB SAB Ectopic Multiple Living   4 2 2  0 2 1 1  0 0 2     Review of Systems  10 systems reviewed and found to be negative, except as noted in the HPI.   Allergies  Other; Penicillins; and Ritalin  Home Medications   Prior to Admission medications   Medication Sig Start Date End Date Taking? Authorizing Provider  albuterol (PROVENTIL HFA;VENTOLIN HFA) 108 (90 BASE) MCG/ACT inhaler Inhale 1-2 puffs into the lungs every 6 (six) hours as needed for wheezing or shortness of breath.    Historical Provider, MD  buPROPion (WELLBUTRIN XL) 150 MG 24 hr tablet Take 1 tablet (150 mg total) by mouth every morning. 11/07/13   Raelyn Mora, CNM  famotidine-calcium carbonate-magnesium hydroxide (PEPCID COMPLETE) 10-800-165 MG CHEW chewable tablet Chew 1 tablet by mouth at bedtime.  Historical Provider, MD  hydrocortisone (ANUSOL-HC) 2.5 % rectal cream Place 1 application rectally 4 (four) times daily. 11/07/13   Raelyn Mora, CNM  hydrocortisone-pramoxine [facility]) rectal foam Place 1 applicator rectally at bedtime as needed for hemorrhoids or itching.    Historical Provider, MD  ibuprofen (ADVIL,MOTRIN) 600 MG tablet Take 1 tablet (600 mg total) by mouth every 6 (six) hours. 11/07/13   Raelyn Mora, CNM  omeprazole (PRILOSEC OTC) 20 MG tablet Take 20 mg by mouth every morning.     Historical Provider, MD  oxyCODONE-acetaminophen (PERCOCET/ROXICET) 5-325 MG per tablet Take 1 tablet by mouth  every 4 (four) hours as needed (for pain scale less than 7). 11/07/13   Raelyn Mora, CNM  Prenatal Vit-Fe Fumarate-FA (PRENATAL MULTIVITAMIN) TABS Take 1 tablet by mouth every morning.     Historical Provider, MD  Probiotic Product (PROBIOTIC FORMULA PO) Take 1 capsule by mouth every morning.     Historical Provider, MD  ursodiol (ACTIGALL) 300 MG capsule Take 300 mg by mouth 3 (three) times daily.    Historical Provider, MD   BP 125/82 mmHg  Pulse 63  Temp(Src) 98.3 F (36.8 C) (Oral)  Resp 18  Ht  (1.575 m)  Wt 172 lb (78.019 kg)  BMI 31.45 kg/m2  SpO2 100%  LMP 08/03/2014 Physical Exam  Constitutional: She is oriented to person, place, and time. She appears well-developed and well-nourished. No distress.  HENT:  Head: Normocephalic.  Eyes: Conjunctivae and EOM are normal.  Cardiovascular: Normal rate.   Pulmonary/Chest: Effort normal. No stridor.  Musculoskeletal: Normal range of motion.  Neurological: She is alert and oriented to person, place, and time.  Skin:  Sewing needle penetrates through the left distal second digit through the nail. Bleeding is controlled.  Psychiatric: She has a normal mood and affect.  Nursing note and vitals reviewed.   ED Course  FOREIGN BODY REMOVAL Date/Time: 08/28/2014 8:57 PM Performed by: Wynetta Emery Authorized by: Wynetta Emery Consent: Verbal consent obtained. Consent given by: patient Required items: required blood products, implants, devices, and special equipment available Patient identity confirmed: verbally with patient Body area: skin General location: upper extremity Location details: left index finger Anesthesia: digital block Local anesthetic: lidocaine 1% without epinephrine Anesthetic total: 6 ml Patient sedated: no Patient restrained: no Localization method: visualized Removal mechanism: forceps Dressing: antibiotic ointment Tendon involvement: none Depth: deep Complexity: simple 1 objects  recovered. Objects recovered: sewing needle Post-procedure assessment: foreign body removed Patient tolerance: Patient tolerated the procedure well with no immediate complications   (including critical care time) Labs Review Labs Reviewed - No data to display  Imaging Review Dg Finger Index Left  08/28/2014   CLINICAL DATA:  Sewing needle through left index finger nail  EXAM: LEFT INDEX FINGER 2+V  COMPARISON:  None.  FINDINGS: There is a linear foreign object through the tip of the finger. No evidence of osseous involvement.  IMPRESSION: Foreign body with no osseous involvement identified.   Electronically Signed   By: Esperanza Heir M.D.   On: 08/28/2014 18:09     EKG Interpretation None      MDM   Final diagnoses:  None    Filed Vitals:   08/28/14 1726 08/28/14 1940  BP: 125/82 117/76  Pulse: 63 68  Temp: 98.3 F (36.8 C)   TempSrc: Oral   Resp: 18 16  Height:  (1.575 m)   Weight: 172 lb (78.019 kg)   SpO2: 100% 100%    Medications  Tdap (BOOSTRIX) injection 0.5 mL (0.5 mLs Intramuscular Not Given 08/28/14 1850)  bacitracin ointment 1 application (not administered)  ibuprofen (ADVIL,MOTRIN) tablet 400 mg (400 mg Oral Given 08/28/14 1738)  lidocaine (PF) (XYLOCAINE) 1 % injection 5 mL (5 mLs Infiltration Given 08/28/14 1921)  cephALEXin (KEFLEX) capsule 500 mg (500 mg Oral Given 08/28/14 1932)    [name] is a pleasant 34 y.o. female presenting with puncture wound through the finger and fingernail. Wound is clean and foreign bodies removed without complication. Tetanus is up to date. Patient will be counseled on wound care and return precautions, prophylactic antibiotics initiated.  Evaluation does not show pathology that would require ongoing emergent intervention or inpatient treatment. Pt is hemodynamically stable and mentating appropriately. Discussed findings and plan with patient/guardian, who agrees with care plan. All questions answered. Return  precautions discussed and outpatient follow up given.   Discharge Medication List as of 08/28/2014  7:28 PM    START taking these medications   Details  cephALEXin (KEFLEX) 500 MG capsule Take 1 capsule (500 mg total) by mouth 4 (four) times daily., Starting 08/28/2014, Until Discontinued, Print             Wynetta Emery, PA-C 08/28/14 2059  Tilden Fossa, MD 08/28/14 [phone]

## 2017-01-30 ENCOUNTER — Encounter (HOSPITAL_COMMUNITY): Payer: Self-pay

## 2017-02-04 ENCOUNTER — Other Ambulatory Visit: Payer: Self-pay | Admitting: Obstetrics and Gynecology

## 2017-02-12 NOTE — Patient Instructions (Addendum)
Your procedure is scheduled on:  Thursday, Dec. 20, 2018  [facility]:  9:45 AM  Pick up the phone at the desk and dial [phone].  Call this number if you have problems the morning of surgery: [phone].  Remember: Do NOT eat food or drink after:  Midnight Wednesday  Take these medicines the morning of surgery with a SIP OF WATER: Omeprazole  Bring Asthma Inhaler day of surgery  Stop ALL herbal medications at this time  Do NOT smoke the day of surgery.  Do NOT wear jewelry (body piercing), metal hair clips/bobby pins, make-up, artifical eyelashes or nail polish. Do NOT wear lotions, powders, or perfumes.  You may wear deodorant. Do NOT shave for 48 hours prior to surgery. Do NOT bring valuables to the hospital. Contacts, dentures, or bridgework may not be worn into surgery.  Have a responsible adult drive you home and stay with you for 24 hours after your procedure  Bring a copy of your healthcare power of attorney and living will documents.  [place] - Preparing for Surgery Before surgery, you can play an important role.  Because skin is not sterile, your skin needs to be as free of germs as possible.  You can reduce the number of germs on your skin by washing with CHG (chlorahexidine gluconate) soap before surgery.  CHG is an antiseptic cleaner which kills germs and bonds with the skin to continue killing germs even after washing. Please DO NOT use if you have an allergy to CHG or antibacterial soaps.  If your skin becomes reddened/irritated stop using the CHG and inform your nurse when you arrive at Short Stay. Do not shave (including legs and underarms) for at least 48 hours prior to the first CHG shower.  You may shave your face/neck.  Please follow these instructions carefully:  1.  Shower with CHG Soap the night before surgery and the  morning of surgery.  2.  If you choose to wash your hair, wash your hair first as usual with  your normal  shampoo.  3.  After you shampoo, rinse your hair and body thoroughly to remove the shampoo.                             4.  Use CHG as you would any other liquid soap.  You can apply chg directly to the skin and wash.  Gently with a scrungie or clean washcloth.  5.  Apply the CHG Soap to your body ONLY FROM THE NECK DOWN.   Do   not use on face/ open                           Wound or open sores. Avoid contact with eyes, ears mouth and   genitals (private parts).                       Wash face,  Genitals (private parts) with your normal soap.             6.  Wash thoroughly, paying special attention to the area where your    surgery  will be performed.  7.  Thoroughly rinse your body with warm water from the neck down.  8.  DO NOT shower/wash with your normal soap after using and rinsing off the CHG Soap.  9.  Pat yourself dry with a clean towel.            10.  Wear clean pajamas.            11.  Place clean sheets on your bed the night of your first shower and do not  sleep with pets. Day of Surgery : Do not apply any lotions/deodorants the morning of surgery.  Please wear clean clothes to the hospital/surgery center.  FAILURE TO FOLLOW THESE INSTRUCTIONS MAY RESULT IN THE CANCELLATION OF YOUR SURGERY  PATIENT SIGNATURE_________________________________  NURSE SIGNATURE__________________________________  ________________________________________________________________________

## 2017-02-18 ENCOUNTER — Encounter (HOSPITAL_COMMUNITY): Payer: Self-pay

## 2017-02-18 ENCOUNTER — Other Ambulatory Visit: Payer: Self-pay

## 2017-02-18 ENCOUNTER — Encounter (HOSPITAL_COMMUNITY)
Admission: RE | Admit: 2017-02-18 | Discharge: 2017-02-18 | Disposition: A | Discharge status: Home / Self Care | Payer: BLUE CROSS/BLUE SHIELD | Source: Ambulatory Visit | Attending: Obstetrics and Gynecology | Admitting: Obstetrics and Gynecology

## 2017-02-18 DIAGNOSIS — J45909 Unspecified asthma, uncomplicated: Secondary | ICD-10-CM | POA: Diagnosis not present

## 2017-02-18 DIAGNOSIS — Z880 Allergy status to penicillin: Secondary | ICD-10-CM | POA: Diagnosis not present

## 2017-02-18 DIAGNOSIS — Z79899 Other long term (current) drug therapy: Secondary | ICD-10-CM | POA: Diagnosis not present

## 2017-02-18 DIAGNOSIS — E8809 Other disorders of plasma-protein metabolism, not elsewhere classified: Secondary | ICD-10-CM | POA: Diagnosis not present

## 2017-02-18 DIAGNOSIS — N921 Excessive and frequent menstruation with irregular cycle: Secondary | ICD-10-CM | POA: Diagnosis not present

## 2017-02-18 DIAGNOSIS — N840 Polyp of corpus uteri: Secondary | ICD-10-CM | POA: Diagnosis not present

## 2017-02-18 DIAGNOSIS — Z87891 Personal history of nicotine dependence: Secondary | ICD-10-CM | POA: Diagnosis not present

## 2017-02-18 DIAGNOSIS — K219 Gastro-esophageal reflux disease without esophagitis: Secondary | ICD-10-CM | POA: Diagnosis not present

## 2017-02-18 DIAGNOSIS — Z30432 Encounter for removal of intrauterine contraceptive device: Secondary | ICD-10-CM | POA: Diagnosis not present

## 2017-02-18 DIAGNOSIS — Z888 Allergy status to other drugs, medicaments and biological substances status: Secondary | ICD-10-CM | POA: Diagnosis not present

## 2017-02-18 HISTORY — DX: Headache, unspecified: R51.9

## 2017-02-18 HISTORY — DX: Calculus of gallbladder without cholecystitis without obstruction: K80.20

## 2017-02-18 HISTORY — DX: Headache: R51

## 2017-02-18 HISTORY — DX: Nontoxic single thyroid nodule: E04.1

## 2017-02-18 HISTORY — DX: Other disorders of plasma-protein metabolism, not elsewhere classified: E88.09

## 2017-02-18 LAB — BASIC METABOLIC PANEL
Anion gap: 8 (ref 5–15)
BUN: 10 mg/dL (ref 6–20)
CO2: 25 mmol/L (ref 22–32)
Calcium: 9.6 mg/dL (ref 8.9–10.3)
Chloride: 104 mmol/L (ref 101–111)
Creatinine, Ser: 0.57 mg/dL (ref 0.44–1.00)
GFR calc Af Amer: >60 mL/min (ref >60)
Glucose, Bld: 105 mg/dL — ABNORMAL HIGH (ref 65–99)
Potassium: 3.8 mmol/L (ref 3.5–5.1)
Sodium: 137 mmol/L (ref 135–145)

## 2017-02-18 LAB — CBC
HEMATOCRIT: 42.7 % (ref 36.0–46.0)
Hemoglobin: 14.2 g/dL (ref 12.0–15.0)
MCH: 29.3 pg (ref 26.0–34.0)
MCHC: 33.3 g/dL (ref 30.0–36.0)
MCV: 88.2 fL (ref 78.0–100.0)
PLATELETS: 308 10*3/uL (ref 150–400)
RBC: 4.84 MIL/uL (ref 3.87–5.11)
RDW: 13.5 % (ref 11.5–15.5)
WBC: 7.6 10*3/uL (ref 4.0–10.5)

## 2017-02-18 NOTE — Pre-Procedure Instructions (Signed)
Made Lenny PastelSusan R. RN at OR desk and staff in short stay center aware of Ms. Mundt's history of pseudochloinesrterase deficiency.

## 2017-02-18 NOTE — Pre-Procedure Instructions (Signed)
Dr. Tacy Duraddono made aware of Lauren [name]'s personal history of pseudocholinesterase deficiency.  He did not require an anesthesia consult with her but I will label the chart to make everyone aware.

## 2017-02-19 NOTE — H&P (Signed)
NAMLester Kinsman:  Gammel, REBECCA               ACCOUNT NO.:  [id number]  MEDICAL RECORD NO.:  [id number]  LOCATION:                                 FACILITY:  PHYSICIAN:  Lenoard Adenichard J. Shaynna Husby, M.D.     DATE OF BIRTH:  DATE OF ADMISSION: DATE OF DISCHARGE:                             HISTORY & PHYSICAL   CHIEF COMPLAINT:  Refractory menorrhagia.  HISTORY OF PRESENT ILLNESS:  She is a 36 year old white female G6, P1, who presents for definitive therapy.  MEDICATIONS:  Include Mirena, albuterol, Prozac, and multivitamin.  SOCIAL HISTORY:  Nonsmoker, nondrinker.  She denies domestic or physical violence.  PHYSICAL EXAMINATION:  GENERAL:  Well-developed, well-nourished white female, in no acute distress. HEENT:  Normal. NECK:  Supple.  Full range of motion. LUNGS:  Clear. HEART:  Regular rate and rhythm. ABDOMEN:  Soft, nontender. PELVIC:  Reveals normal external female genitalia.  Uterus is normal size, shape, and contour.  No adnexal masses are appreciated. EXTREMITIES:  There are no cords. NEUROLOGIC:  Nonfocal. SKIN:  Intact.  IMPRESSION:  Refractory menometrorrhagia for definitive therapy.  PLAN:  Proceed with diagnostic hysteroscopy, D and C, IUD removal, NovaSure endometrial ablation.  Risks of anesthesia, infection, bleeding, injury to surrounding organs, possible need for repair discussed, delayed versus immediate complications to include bowel and bladder injury noted.  The patient acknowledges and wishes to proceed.     Lenoard Adenichard J. Bomani Oommen, M.D.     RJT/MEDQ  D:  02/19/2017  T:  02/19/2017  Job:  [id number]

## 2017-02-20 ENCOUNTER — Encounter (HOSPITAL_COMMUNITY)
Admission: AD | Disposition: A | Discharge status: Home / Self Care | Payer: Self-pay | Source: Ambulatory Visit | Attending: Obstetrics and Gynecology

## 2017-02-20 ENCOUNTER — Ambulatory Visit (HOSPITAL_COMMUNITY): Payer: BLUE CROSS/BLUE SHIELD | Admitting: Certified Registered"

## 2017-02-20 ENCOUNTER — Encounter (HOSPITAL_COMMUNITY): Payer: Self-pay

## 2017-02-20 ENCOUNTER — Ambulatory Visit (HOSPITAL_COMMUNITY)
Admission: AD | Admit: 2017-02-20 | Discharge: 2017-02-20 | Disposition: A | Discharge status: Home / Self Care | Payer: BLUE CROSS/BLUE SHIELD | Source: Ambulatory Visit | Attending: Obstetrics and Gynecology | Admitting: Obstetrics and Gynecology

## 2017-02-20 DIAGNOSIS — N840 Polyp of corpus uteri: Secondary | ICD-10-CM | POA: Insufficient documentation

## 2017-02-20 DIAGNOSIS — J45909 Unspecified asthma, uncomplicated: Secondary | ICD-10-CM | POA: Insufficient documentation

## 2017-02-20 DIAGNOSIS — Z87891 Personal history of nicotine dependence: Secondary | ICD-10-CM | POA: Insufficient documentation

## 2017-02-20 DIAGNOSIS — K219 Gastro-esophageal reflux disease without esophagitis: Secondary | ICD-10-CM | POA: Insufficient documentation

## 2017-02-20 DIAGNOSIS — Z888 Allergy status to other drugs, medicaments and biological substances status: Secondary | ICD-10-CM | POA: Insufficient documentation

## 2017-02-20 DIAGNOSIS — Z880 Allergy status to penicillin: Secondary | ICD-10-CM | POA: Insufficient documentation

## 2017-02-20 DIAGNOSIS — N921 Excessive and frequent menstruation with irregular cycle: Secondary | ICD-10-CM | POA: Diagnosis not present

## 2017-02-20 DIAGNOSIS — E8809 Other disorders of plasma-protein metabolism, not elsewhere classified: Secondary | ICD-10-CM | POA: Insufficient documentation

## 2017-02-20 DIAGNOSIS — Z79899 Other long term (current) drug therapy: Secondary | ICD-10-CM | POA: Insufficient documentation

## 2017-02-20 DIAGNOSIS — Z30432 Encounter for removal of intrauterine contraceptive device: Secondary | ICD-10-CM | POA: Insufficient documentation

## 2017-02-20 HISTORY — PX: DILITATION & CURRETTAGE/HYSTROSCOPY WITH NOVASURE ABLATION: SHX5568

## 2017-02-20 SURGERY — DILATATION & CURETTAGE/HYSTEROSCOPY WITH NOVASURE ABLATION
Anesthesia: General | Site: Vagina

## 2017-02-20 MED ORDER — LACTATED RINGERS IV SOLN
INTRAVENOUS | Status: DC
  Administered 2017-02-20: 1000 mL via INTRAVENOUS

## 2017-02-20 MED ORDER — FENTANYL CITRATE (PF) 100 MCG/2ML IJ SOLN: 25.0000 ug | INTRAMUSCULAR | Status: DC | PRN

## 2017-02-20 MED ORDER — SCOPOLAMINE 1 MG/3DAYS TD PT72
MEDICATED_PATCH | TRANSDERMAL | Status: AC
  Filled 2017-02-20: qty 1

## 2017-02-20 MED ORDER — ONDANSETRON HCL 4 MG/2ML IJ SOLN
INTRAMUSCULAR | Status: DC | PRN
  Administered 2017-02-20: 4 mg via INTRAVENOUS

## 2017-02-20 MED ORDER — PROPOFOL 10 MG/ML IV BOLUS
INTRAVENOUS | Status: DC | PRN
  Administered 2017-02-20: 200 mg via INTRAVENOUS

## 2017-02-20 MED ORDER — KETOROLAC TROMETHAMINE 30 MG/ML IJ SOLN
INTRAMUSCULAR | Status: AC
  Filled 2017-02-20: qty 1

## 2017-02-20 MED ORDER — SODIUM CHLORIDE 0.9 % IR SOLN
Status: DC | PRN
  Administered 2017-02-20: 3000 mL

## 2017-02-20 MED ORDER — PROPOFOL 10 MG/ML IV BOLUS
INTRAVENOUS | Status: AC
  Filled 2017-02-20: qty 20

## 2017-02-20 MED ORDER — DEXAMETHASONE SODIUM PHOSPHATE 10 MG/ML IJ SOLN
INTRAMUSCULAR | Status: DC | PRN
  Administered 2017-02-20: 4 mg via INTRAVENOUS

## 2017-02-20 MED ORDER — ONDANSETRON HCL 4 MG/2ML IJ SOLN
INTRAMUSCULAR | Status: AC
  Filled 2017-02-20: qty 2

## 2017-02-20 MED ORDER — LIDOCAINE HCL (CARDIAC) 20 MG/ML IV SOLN
INTRAVENOUS | Status: AC
  Filled 2017-02-20: qty 5

## 2017-02-20 MED ORDER — OXYCODONE HCL 5 MG PO TABS
ORAL_TABLET | ORAL | Status: AC
  Filled 2017-02-20: qty 1

## 2017-02-20 MED ORDER — LACTATED RINGERS IV SOLN: INTRAVENOUS | Status: DC

## 2017-02-20 MED ORDER — MEPERIDINE HCL 25 MG/ML IJ SOLN: 6.2500 mg | INTRAMUSCULAR | Status: DC | PRN

## 2017-02-20 MED ORDER — SCOPOLAMINE 1 MG/3DAYS TD PT72
1.0000 | MEDICATED_PATCH | Freq: Once | TRANSDERMAL | Status: DC
  Administered 2017-02-20: 1.5 mg via TRANSDERMAL

## 2017-02-20 MED ORDER — KETOROLAC TROMETHAMINE 30 MG/ML IJ SOLN
INTRAMUSCULAR | Status: DC | PRN
  Administered 2017-02-20: 30 mg via INTRAVENOUS

## 2017-02-20 MED ORDER — MIDAZOLAM HCL 2 MG/2ML IJ SOLN
INTRAMUSCULAR | Status: AC
  Filled 2017-02-20: qty 2

## 2017-02-20 MED ORDER — CEFAZOLIN SODIUM-DEXTROSE 2-3 GM-%(50ML) IV SOLR
INTRAVENOUS | Status: AC
  Filled 2017-02-20: qty 50

## 2017-02-20 MED ORDER — OXYCODONE HCL 5 MG PO TABS
5.0000 mg | ORAL_TABLET | Freq: Once | ORAL | Status: AC
  Administered 2017-02-20: 5 mg via ORAL

## 2017-02-20 MED ORDER — LIDOCAINE 2% (20 MG/ML) 5 ML SYRINGE
INTRAMUSCULAR | Status: DC | PRN
  Administered 2017-02-20: 40 mg via INTRAVENOUS

## 2017-02-20 MED ORDER — TRAMADOL HCL 50 MG PO TABS: 50.0000 mg | ORAL_TABLET | Freq: Four times a day (QID) | ORAL | 0 refills | Status: DC | PRN

## 2017-02-20 MED ORDER — CEFAZOLIN SODIUM-DEXTROSE 2-4 GM/100ML-% IV SOLN
2.0000 g | INTRAVENOUS | Status: AC
  Administered 2017-02-20: 2 g via INTRAVENOUS
  Filled 2017-02-20: qty 100

## 2017-02-20 MED ORDER — MIDAZOLAM HCL 5 MG/5ML IJ SOLN
INTRAMUSCULAR | Status: DC | PRN
  Administered 2017-02-20: 2 mg via INTRAVENOUS

## 2017-02-20 MED ORDER — METOCLOPRAMIDE HCL 5 MG/ML IJ SOLN: 10.0000 mg | Freq: Once | INTRAMUSCULAR | Status: DC | PRN

## 2017-02-20 MED ORDER — FENTANYL CITRATE (PF) 100 MCG/2ML IJ SOLN
INTRAMUSCULAR | Status: AC
  Filled 2017-02-20: qty 2

## 2017-02-20 MED ORDER — FENTANYL CITRATE (PF) 100 MCG/2ML IJ SOLN
INTRAMUSCULAR | Status: DC | PRN
  Administered 2017-02-20 (×2): 50 ug via INTRAVENOUS

## 2017-02-20 MED ORDER — BUPIVACAINE HCL (PF) 0.25 % IJ SOLN
INTRAMUSCULAR | Status: DC | PRN
  Administered 2017-02-20: 20 mL

## 2017-02-20 MED ORDER — DEXAMETHASONE SODIUM PHOSPHATE 4 MG/ML IJ SOLN
INTRAMUSCULAR | Status: AC
  Filled 2017-02-20: qty 1

## 2017-02-20 SURGICAL SUPPLY — 15 items
ABLATOR ENDOMETRIAL BIPOLAR (ABLATOR) ×3 IMPLANT
CANISTER SUCT 3000ML PPV (MISCELLANEOUS) ×3 IMPLANT
CATH ROBINSON RED A/P 16FR (CATHETERS) ×3 IMPLANT
CONTAINER PREFILL 10% NBF 60ML (FORM) ×3 IMPLANT
GLOVE BIO SURGEON STRL SZ7.5 (GLOVE) ×3 IMPLANT
GLOVE BIOGEL PI IND STRL 7.0 (GLOVE) ×1 IMPLANT
GLOVE BIOGEL PI INDICATOR 7.0 (GLOVE) ×2
GOWN STRL REUS W/TWL LRG LVL3 (GOWN DISPOSABLE) ×6 IMPLANT
PACK VAGINAL MINOR WOMEN LF (CUSTOM PROCEDURE TRAY) ×3 IMPLANT
PAD OB MATERNITY 4.3X12.25 (PERSONAL CARE ITEMS) ×3 IMPLANT
PAD PREP 24X48 CUFFED NSTRL (MISCELLANEOUS) ×3 IMPLANT
SYR TB 1ML 25GX5/8 (SYRINGE) ×3 IMPLANT
TOWEL OR 17X24 6PK STRL BLUE (TOWEL DISPOSABLE) ×6 IMPLANT
TUBING AQUILEX INFLOW (TUBING) ×3 IMPLANT
TUBING AQUILEX OUTFLOW (TUBING) ×3 IMPLANT

## 2017-02-20 NOTE — Discharge Instructions (Addendum)
DISCHARGE INSTRUCTIONS: HYSTEROSCOPY / ENDOMETRIAL ABLATION The following instructions have been prepared to help you care for yourself upon your return home.  May Remove Scop patch on or before: 12/23                       May take Ibuprofen after: 5:15 today  May take stool softner while taking narcotic pain medication to prevent constipation.  Drink plenty of water.  Personal hygiene:  Use sanitary pads for vaginal drainage, not tampons.  Shower the day after your procedure.  NO tub baths, pools or Jacuzzis for 2-3 weeks.  Wipe front to back after using the bathroom.  Activity and limitations:  Do NOT drive or operate any equipment for 24 hours. The effects of anesthesia are still present and drowsiness may result.  Do NOT rest in bed all day.  Walking is encouraged.  Walk up and down stairs slowly.  You may resume your normal activity in one to two days or as indicated by your physician. Sexual activity: NO intercourse for at least 2 weeks after the procedure, or as indicated by your Doctor.  Diet: Eat a light meal as desired this evening. You may resume your usual diet tomorrow.  Return to Work: You may resume your work activities in one to two days or as indicated by Therapist, sportsyour Doctor.  What to expect after your surgery: Expect to have vaginal bleeding/discharge for 2-3 days and spotting for up to 10 days. It is not unusual to have soreness for up to 1-2 weeks. You may have a slight burning sensation when you urinate for the first day. Mild cramps may continue for a couple of days. You may have a regular period in 2-6 weeks.  Call your doctor for any of the following:  Excessive vaginal bleeding or clotting, saturating and changing one pad every hour.  Inability to urinate 6 hours after discharge from hospital.  Pain not relieved by pain medication.  Fever of 100.4 F or greater.  Unusual vaginal discharge or odor.  Return to office _________________Call for an  appointment ___________________ Patients signature: ______________________ Nurses signature ________________________  Post Anesthesia Care Unit [phone]  Post Anesthesia Home Care Instructions  Activity: Get plenty of rest for the remainder of the day. A responsible individual must stay with you for 24 hours following the procedure.  For the next 24 hours, DO NOT: -Drive a car -Advertising copywriterperate machinery -Drink alcoholic beverages -Take any medication unless instructed by your physician -Make any legal decisions or sign important papers.  Meals: Start with liquid foods such as gelatin or soup. Progress to regular foods as tolerated. Avoid greasy, spicy, heavy foods. If nausea and/or vomiting occur, drink only clear liquids until the nausea and/or vomiting subsides. Call your physician if vomiting continues.  Special Instructions/Symptoms: Your throat may feel dry or sore from the anesthesia or the breathing tube placed in your throat during surgery. If this causes discomfort, gargle with warm salt water. The discomfort should disappear within 24 hours.  If you had a scopolamine patch placed behind your ear for the management of post- operative nausea and/or vomiting:  1. The medication in the patch is effective for 72 hours, after which it should be removed.  Wrap patch in a tissue and discard in the trash. Wash hands thoroughly with soap and water. 2. You may remove the patch earlier than 72 hours if you experience unpleasant side effects which may include dry mouth, dizziness or visual disturbances.  3. Avoid touching the patch. Wash your hands with soap and water after contact with the patch. °  ° °

## 2017-02-20 NOTE — Op Note (Signed)
NAMGayla [name]:  Arthurs, [name]               ACCOUNT NO.:  [id number]  MEDICAL RECORD NO.:  [id number]  LOCATION:                                 FACILITY:  PHYSICIAN:  Lenoard Adenichard J. Holman Bonsignore, M.D.     DATE OF BIRTH:  DATE OF PROCEDURE: DATE OF DISCHARGE:                              OPERATIVE REPORT   PREOPERATIVE DIAGNOSIS:  Refractory menometrorrhagia.  POSTOPERATIVE DIAGNOSES:  Refractory menometrorrhagia, endometrial polyp.  PROCEDURES:  Diagnostic hysteroscopy, D and C, removal of left lateral wall endometrial polyp, NovaSure endometrial ablation, and removal of intrauterine device.  SURGEON:  Lenoard Adenichard J. Aubert Choyce, M.D.  ASSISTANT:  None.  ANESTHESIA:  General, local.  ESTIMATED BLOOD LOSS:  Less than 50 mL.  FLUID DEFICIT:  65 mL.  COMPLICATIONS:  None.  DRAINS:  None.  COUNTS:  Correct.  The patient to recovery in good condition.  BRIEF OPERATIVE NOTE:  After being apprised of risks of anesthesia, infection, bleeding, injury to surrounding organs, possible need for repair, delayed versus immediate complications to include bowel and bladder injury, possible need for repair, the patient was brought to the operating room, where she was administered general anesthetic without complications.  Prepped and draped in usual sterile fashion. Catheterized until the bladder was empty.  Exam under anesthesia reveals a mid-positioned uterus.  No adnexal masses.  Dilute Marcaine solution placed, 20 mL total.  Hysteroscope placed after dilating easily up to 25 Pratt dilator.  Visualization reveals normal endometrial cavity with a left lateral wall endometrial polyp.  D and C performed using sharp curettage in a 4-quadrant method.  IUD was removed prior to that, which was seen to be in normal location, and the endometrial polyp was removed as part of the D and C specimen and sent to Pathology for confirmation. At this time, the NovaSure device was placed, seated to a length of 6.5, a  width of 4.5 initiated after negative CO2 test to a power of 161 watts for 49 seconds.  Device was removed, inspected, and found to be intact. Revisualization of endometrial cavity reveals an otherwise normal endometrial cavity, which was well ablated.  At this time, procedure was terminated.  Fluid deficit was noted.  The patient tolerated the procedure well, was awakened and transferred to recovery in good condition.     Lenoard Adenichard J. Emari Demmer, M.D.     RJT/MEDQ  D:  02/20/2017  T:  02/20/2017  Job:  [id number]  cc:   Lenoard Adenichard J. Ashvin Adelson, M.D. Fax: [phone]

## 2017-02-20 NOTE — Op Note (Addendum)
02/20/2017  11:35 AM  PATIENT:  Lauren [name]  36 y.o. female  PRE-OPERATIVE DIAGNOSIS:  Menorrhagia  POST-OPERATIVE DIAGNOSIS:  Menorrhagia Endometrial polyp  PROCEDURE:  Procedure(s): DILATATION & CURETTAGE HYSTEROSCOPY WITH NOVASURE ABLATION IUD REMOVAL ENDOMETRIAL POLYPECTOMY  SURGEON:  Surgeon(s): Olivia Mackieaavon, Jiovanni Heeter, MD  ASSISTANTS: none   ANESTHESIA:   local and general  ESTIMATED BLOOD LOSS: 20 mL   DRAINS: none   LOCAL MEDICATIONS USED:  MARCAINE    and Amount: 20 ml  SPECIMEN:  Source of Specimen:  EMC and polyp  DISPOSITION OF SPECIMEN:  PATHOLOGY  COUNTS:  YES  DICTATION #: [id number]  PLAN OF CARE: dc home  PATIENT DISPOSITION:  PACU - hemodynamically stable.

## 2017-02-20 NOTE — Progress Notes (Signed)
Patient ID: Lauren [name], female   DOB: 02-16-1981, 36 y.o.   MRN: [id number] Patient seen and examined. Consent witnessed and signed. No changes noted. Update completed.

## 2017-02-20 NOTE — Anesthesia Preprocedure Evaluation (Signed)
Anesthesia Evaluation  Patient identified by MRN, date of birth, ID band Patient awake    Reviewed: Allergy & Precautions, NPO status , Patient's Chart, lab work & pertinent test results  History of Anesthesia Complications (+) PSEUDOCHOLINESTERASE DEFICIENCY  Airway Mallampati: II  TM Distance: >3 FB Neck ROM: Full    Dental no notable dental hx.    Pulmonary asthma , former smoker,    Pulmonary exam normal breath sounds clear to auscultation       Cardiovascular negative cardio ROS Normal cardiovascular exam Rhythm:Regular Rate:Normal     Neuro/Psych negative neurological ROS  negative psych ROS   GI/Hepatic Neg liver ROS, GERD  Medicated and Controlled,  Endo/Other  negative endocrine ROS  Renal/GU negative Renal ROS  negative genitourinary   Musculoskeletal negative musculoskeletal ROS (+)   Abdominal   Peds negative pediatric ROS (+)  Hematology negative hematology ROS (+)   Anesthesia Other Findings   Reproductive/Obstetrics negative OB ROS                             Anesthesia Physical Anesthesia Plan  ASA: II  Anesthesia Plan: General   Post-op Pain Management:    Induction: Intravenous  PONV Risk Score and Plan: 4 or greater and Ondansetron, Dexamethasone, Midazolam, Scopolamine patch - Pre-op and Treatment may vary due to age or medical condition  Airway Management Planned: LMA  Additional Equipment:   Intra-op Plan:   Post-operative Plan: Extubation in OR  Informed Consent: I have reviewed the patients History and Physical, chart, labs and discussed the procedure including the risks, benefits and alternatives for the proposed anesthesia with the patient or authorized representative who has indicated his/her understanding and acceptance.   Dental advisory given  Plan Discussed with: CRNA  Anesthesia Plan Comments:         Anesthesia Quick  Evaluation

## 2017-02-20 NOTE — Anesthesia Postprocedure Evaluation (Signed)
Anesthesia Post Note  Patient: Lauren [name]  Procedure(s) Performed: DILATATION & CURETTAGE/HYSTEROSCOPY WITH NOVASURE ABLATION (N/A Vagina )     Patient location during evaluation: PACU Anesthesia Type: General Level of consciousness: awake and alert Pain management: pain level controlled Vital Signs Assessment: post-procedure vital signs reviewed and stable Respiratory status: spontaneous breathing, nonlabored ventilation and respiratory function stable Cardiovascular status: blood pressure returned to baseline and stable Postop Assessment: no apparent nausea or vomiting Anesthetic complications: no    Last Vitals:  Vitals:   02/20/17 1230 02/20/17 1245  BP: 109/65 113/75  Pulse: 63 63  Resp: 12 13  Temp:  37.1 C  SpO2: 98% 99%    Last Pain:  Vitals:   02/20/17 1245  TempSrc:   PainSc: 0-No pain   Pain Goal: Patients Stated Pain Goal: 5 (02/20/17 1245)               Beryle Lathehomas E Khanh Tanori

## 2017-02-20 NOTE — Transfer of Care (Signed)
Immediate Anesthesia Transfer of Care Note  Patient: Lauren [name]  Procedure(s) Performed: DILATATION & CURETTAGE/HYSTEROSCOPY WITH NOVASURE ABLATION (N/A Vagina )  Patient Location: PACU  Anesthesia Type:General  Level of Consciousness: awake, alert , oriented and patient cooperative  Airway & Oxygen Therapy: Patient Spontanous Breathing and Patient connected to nasal cannula oxygen  Post-op Assessment: Report given to RN and Post -op Vital signs reviewed and stable  Post vital signs: Reviewed and stable  Last Vitals:  Vitals:   02/20/17 0827  BP: 110/72  Pulse: 71  Resp: 16  Temp: 36.7 C  SpO2: 99%    Last Pain:  Vitals:   02/20/17 0827  TempSrc: Oral      Patients Stated Pain Goal: 5 (02/20/17 0827)  Complications: No apparent anesthesia complications

## 2017-02-20 NOTE — Anesthesia Procedure Notes (Signed)
Procedure Name: LMA Insertion Date/Time: 02/20/2017 11:03 AM Performed by: Vista LawmanEargle, Yeriel Mineo E, CRNA Pre-anesthesia Checklist: Patient identified, Emergency Drugs available, Suction available and Patient being monitored Patient Re-evaluated:Patient Re-evaluated prior to induction Oxygen Delivery Method: Circle system utilized Preoxygenation: Pre-oxygenation with 100% oxygen Induction Type: IV induction LMA: LMA inserted LMA Size: 4.0 Number of attempts: 1 Dental Injury: Teeth and Oropharynx as per pre-operative assessment

## 2017-02-21 ENCOUNTER — Encounter (HOSPITAL_COMMUNITY): Payer: Self-pay | Admitting: Obstetrics and Gynecology

## 2019-01-15 ENCOUNTER — Emergency Department (HOSPITAL_BASED_OUTPATIENT_CLINIC_OR_DEPARTMENT_OTHER)
Admission: EM | Admit: 2019-01-15 | Discharge: 2019-01-16 | Disposition: A | Discharge status: Home / Self Care | Payer: BC Managed Care – PPO | Attending: Emergency Medicine | Admitting: Emergency Medicine

## 2019-01-15 ENCOUNTER — Other Ambulatory Visit: Payer: Self-pay

## 2019-01-15 ENCOUNTER — Emergency Department (HOSPITAL_BASED_OUTPATIENT_CLINIC_OR_DEPARTMENT_OTHER): Payer: BC Managed Care – PPO

## 2019-01-15 ENCOUNTER — Encounter (HOSPITAL_BASED_OUTPATIENT_CLINIC_OR_DEPARTMENT_OTHER): Payer: Self-pay | Admitting: *Deleted

## 2019-01-15 DIAGNOSIS — R0789 Other chest pain: Secondary | ICD-10-CM | POA: Diagnosis present

## 2019-01-15 DIAGNOSIS — J45909 Unspecified asthma, uncomplicated: Secondary | ICD-10-CM | POA: Diagnosis not present

## 2019-01-15 DIAGNOSIS — Z79899 Other long term (current) drug therapy: Secondary | ICD-10-CM | POA: Diagnosis not present

## 2019-01-15 DIAGNOSIS — Z87891 Personal history of nicotine dependence: Secondary | ICD-10-CM | POA: Insufficient documentation

## 2019-01-15 DIAGNOSIS — Z3202 Encounter for pregnancy test, result negative: Secondary | ICD-10-CM | POA: Diagnosis not present

## 2019-01-15 LAB — CBC
HCT: 41.1 % (ref 36.0–46.0)
Hemoglobin: 14 g/dL (ref 12.0–15.0)
MCH: 29.4 pg (ref 26.0–34.0)
MCHC: 34.1 g/dL (ref 30.0–36.0)
MCV: 86.2 fL (ref 80.0–100.0)
Platelets: 332 10*3/uL (ref 150–400)
RBC: 4.77 MIL/uL (ref 3.87–5.11)
RDW: 12.7 % (ref 11.5–15.5)
WBC: 7.6 10*3/uL (ref 4.0–10.5)
nRBC: 0 % (ref 0.0–0.2)

## 2019-01-15 LAB — BASIC METABOLIC PANEL
Anion gap: 9 (ref 5–15)
BUN: 12 mg/dL (ref 6–20)
CO2: 21 mmol/L — ABNORMAL LOW (ref 22–32)
Calcium: 9.3 mg/dL (ref 8.9–10.3)
Chloride: 108 mmol/L (ref 98–111)
Creatinine, Ser: 0.62 mg/dL (ref 0.44–1.00)
GFR calc Af Amer: >60 mL/min (ref >60)
GFR calc non Af Amer: >60 mL/min (ref >60)
Glucose, Bld: 122 mg/dL — ABNORMAL HIGH (ref 70–99)
Potassium: 3.4 mmol/L — ABNORMAL LOW (ref 3.5–5.1)
Sodium: 138 mmol/L (ref 135–145)

## 2019-01-15 LAB — TROPONIN I (HIGH SENSITIVITY): Troponin I (High Sensitivity): 2 ng/L (ref <18)

## 2019-01-15 MED ORDER — SODIUM CHLORIDE 0.9% FLUSH
3.0000 mL | Freq: Once | INTRAVENOUS | Status: DC
  Filled 2019-01-15: qty 3

## 2019-01-15 NOTE — ED Triage Notes (Signed)
Difficulty swallowing for 2 days. Hx of hiatal hernia. Lightheaded at times. Pressure in the center of her chest since yesterday associated with abdominal pain.

## 2019-01-15 NOTE — ED Notes (Signed)
Patient transported to X-ray 

## 2019-01-16 LAB — PREGNANCY, URINE: Preg Test, Ur: NEGATIVE

## 2019-01-16 LAB — D-DIMER, QUANTITATIVE: D-Dimer, Quant: <0.27 ug/mL-FEU (ref 0.00–0.50)

## 2019-01-16 LAB — TROPONIN I (HIGH SENSITIVITY): Troponin I (High Sensitivity): 3 ng/L (ref <18)

## 2019-01-16 MED ORDER — ALUM & MAG HYDROXIDE-SIMETH 200-200-20 MG/5ML PO SUSP
30.0000 mL | Freq: Once | ORAL | Status: DC
  Filled 2019-01-16: qty 30

## 2019-01-16 NOTE — Discharge Instructions (Signed)
Your troponins, the measure of heart damage was negative.  Your D-dimer was also negative which means is very unlikely to be a blood clot in the lung.  Based on your history the most likely diagnosis would be reflux disease.  Please follow-up with your family doctor.  Try zantac or pepcid twice a day.  Try to avoid things that may make this worse, most commonly these are spicy foods tomato based products fatty foods chocolate and peppermint.  Alcohol and tobacco can also make this worse.  Return to the emergency department for sudden worsening pain fever or inability to eat or drink.

## 2019-01-16 NOTE — ED Provider Notes (Signed)
[facility] EMERGENCY DEPARTMENT Provider Note   CSN: [id number] Arrival date & time: 01/15/19  2117     History   Chief Complaint No chief complaint on file.   HPI Christ KickRebekah L Hankin is a 38 y.o. female.     38 yo F with chief complaints of chest pain.  Has been going on for the past few weeks.  Worsening over the past couple days.  Describes it as a discomfort under her chest that goes up into her neck.  Has early satiety and some difficulty swallowing.  Denies nausea or vomiting denies cough congestion or fever.  Denies trauma.  Feels that the pain sometimes is pinpoint.  Has a history of reflux as well as a hiatal hernia.  Usually her symptoms are worse right after eating.  Improves with lying back flat or standing up.  Denies history of PE or DVT denies unilateral lower extremity edema denies hemoptysis denies recent surgery immobilization or estrogen use.  Has had some palpitations with her discomfort recently.  Has also felt lightheaded from time to time.  Denies history of MI denies history of hypertension hyperlipidemia diabetes remote smoker denies family history of MI.  The history is provided by the patient.  Illness Severity:  Moderate Onset quality:  Gradual Duration:  2 weeks Timing:  Constant Progression:  Worsening Associated symptoms: chest pain and shortness of breath   Associated symptoms: no abdominal pain, no congestion, no fever, no headaches, no myalgias, no nausea, no rhinorrhea, no vomiting and no wheezing     Past Medical History:  Diagnosis Date  . Active labor 05/14/2011  . Anxiety   . Asthma    uses fast acting inhaler during allergy season  . Cholelithiasis    during pregnancy  . Complication of anesthesia    states allergic to general anesthesia  . Complication of anesthesia    inability to break down certain anesthetic meds  . Depression   . Family history of malignant neoplasm of breast   . Female infertility associated with  anovulation   . GERD (gastroesophageal reflux disease)   . Headache    Migraines with periods  . History of chicken pox   . Other specified diseases of blood and blood-forming organs(289.89)    pseudocholinesterase deficiency  . Pseudocholinesterase deficiency   . Thyroid nodule   . Vaginal Pap smear, abnormal   . Velamentous insertion of umbilical cord 05/14/2011   with last pregnancy    Patient Active Problem List   Diagnosis Date Noted  . SVD (spontaneous vaginal delivery) 11/06/2013  . Postpartum care following vaginal delivery (9/5) 11/06/2013  . Intrahepatic cholestasis of pregnancy, antepartum 11/05/2013  . Cholestasis of pregnancy 11/05/2013    Past Surgical History:  Procedure Laterality Date  . CHOLECYSTECTOMY     2011  . COLONOSCOPY    . DILATION AND EVACUATION  05/15/2011   Procedure: DILATATION AND EVACUATION;  Surgeon: Genia DelMarie-Lyne Lavoie, MD;  Location: [facility];  Service: Gynecology;  Laterality: N/A;  repair of bilateral  vulva tear, and second degree laceration  . DILITATION & CURRETTAGE/HYSTROSCOPY WITH NOVASURE ABLATION N/A 02/20/2017   Procedure: DILATATION & CURETTAGE/HYSTEROSCOPY WITH NOVASURE ABLATION;  Surgeon: Olivia Mackieaavon, Richard, MD;  Location: [facility];  Service: Gynecology;  Laterality: N/A;  Pseudocholinesterase deficiency  . INDUCED ABORTION    . UPPER GI ENDOSCOPY       OB History    Gravida  4   Para  2   Term  2  Preterm  0   AB  2   Living  2     SAB  1   TAB  1   Ectopic  0   Multiple  0   Live Births  2            Home Medications    Prior to Admission medications   Medication Sig Start Date End Date Taking? Authorizing Provider  albuterol (PROVENTIL HFA;VENTOLIN HFA) 108 (90 BASE) MCG/ACT inhaler Inhale 1-2 puffs into the lungs every 6 (six) hours as needed for wheezing or shortness of breath.   Yes [provider]  Multiple Vitamin (MULTIVITAMIN WITH MINERALS) TABS tablet Take 1 tablet by mouth daily.   Yes  [provider]  omeprazole (PRILOSEC OTC) 20 MG tablet Take 20 mg by mouth every morning.    Yes [provider]  famotidine-calcium carbonate-magnesium hydroxide (PEPCID COMPLETE) 10-800-165 MG CHEW chewable tablet Chew 1 tablet by mouth at bedtime.    [provider]  levonorgestrel (MIRENA) 20 MCG/24HR IUD by Intrauterine route once. Placed 12/02/16.    [provider]  traMADol (ULTRAM) 50 MG tablet Take 1-2 tablets (50-100 mg total) by mouth every 6 (six) hours as needed. 02/20/17   Olivia Mackie, MD    Family History Family History  Problem Relation Age of Onset  . Hypertension Mother   . Immunodeficiency Father   . Cancer Maternal Grandmother        leukemia  . Anesthesia problems Neg Hx     Social History Social History   Tobacco Use  . Smoking status: Former Games developer  . Smokeless tobacco: Never Used  . Tobacco comment: quit 9 yrs ago  Substance Use Topics  . Alcohol use: Yes    Comment: occ  . Drug use: No     Allergies   Other, Penicillins, and Ritalin [methylphenidate hcl]   Review of Systems Review of Systems  Constitutional: Negative for chills and fever.  HENT: Negative for congestion and rhinorrhea.   Eyes: Negative for redness and visual disturbance.  Respiratory: Positive for shortness of breath. Negative for wheezing.   Cardiovascular: Positive for chest pain and palpitations.  Gastrointestinal: Negative for abdominal pain, nausea and vomiting.  Genitourinary: Negative for dysuria and urgency.  Musculoskeletal: Negative for arthralgias and myalgias.  Skin: Negative for pallor and wound.  Neurological: Positive for light-headedness. Negative for dizziness and headaches.     Physical Exam Updated Vital Signs BP 114/83 (BP Location: Right Arm) Comment: Simultaneous filing. User may not have seen previous data.  Pulse 76 Comment: Simultaneous filing. User may not have seen previous data.  Temp 99.8 F (37.7 C)  (Oral)   Resp 18 Comment: Simultaneous filing. User may not have seen previous data.  Ht 5\' 2"  (1.575 m)   Wt 87.5 kg   SpO2 98% Comment: Simultaneous filing. User may not have seen previous data.  BMI 35.28 kg/m   Physical Exam Vitals signs and nursing note reviewed.  Constitutional:      General: She is not in acute distress.    Appearance: She is well-developed. She is not diaphoretic.  HENT:     Head: Normocephalic and atraumatic.  Eyes:     Pupils: Pupils are equal, round, and reactive to light.  Neck:     Musculoskeletal: Normal range of motion and neck supple.  Cardiovascular:     Rate and Rhythm: Normal rate and regular rhythm.     Heart sounds: No murmur. No friction rub.  No gallop.   Pulmonary:     Effort: Pulmonary effort is normal.     Breath sounds: No wheezing or rales.  Abdominal:     General: There is no distension.     Palpations: Abdomen is soft.     Tenderness: There is no abdominal tenderness.  Musculoskeletal:        General: No tenderness.  Skin:    General: Skin is warm and dry.  Neurological:     Mental Status: She is alert and oriented to person, place, and time.  Psychiatric:        Behavior: Behavior normal.      ED Treatments / Results  Labs (all labs ordered are listed, but only abnormal results are displayed) Labs Reviewed  BASIC METABOLIC PANEL - Abnormal; Notable for the following components:      Result Value   Potassium 3.4 (*)    CO2 21 (*)    Glucose, Bld 122 (*)    All other components within normal limits  CBC  PREGNANCY, URINE  D-DIMER, QUANTITATIVE (NOT AT [facility])  TROPONIN I (HIGH SENSITIVITY)  TROPONIN I (HIGH SENSITIVITY)    EKG EKG Interpretation  Date/Time:  Friday January 15 2019 21:32:22 EST Ventricular Rate:  105 PR Interval:  152 QRS Duration: 74 QT Interval:  326 QTC Calculation: 430 R Axis:   52 Text Interpretation: Sinus tachycardia Otherwise normal ECG No old tracing to compare Confirmed by Melene Plan [phone]) on 01/15/2019 11:21:34 PM   Radiology Dg Chest 2 View  Result Date: 01/15/2019 CLINICAL DATA:  Chest pain, shortness of breath EXAM: CHEST - 2 VIEW COMPARISON:  10/23/2010 FINDINGS: Heart and mediastinal contours are within normal limits. No focal opacities or effusions. No acute bony abnormality. IMPRESSION: No active cardiopulmonary disease. Electronically Signed   By: Charlett Nose M.D.   On: 01/15/2019 23:02    Procedures Procedures (including critical care time)  Medications Ordered in ED Medications  sodium chloride flush (NS) 0.9 % injection 3 mL (has no administration in time range)  alum & mag hydroxide-simeth (MAALOX/MYLANTA) 200-200-20 MG/5ML suspension 30 mL (30 mLs Oral Refused 01/16/19 0012)     Initial Impression / Assessment and Plan / ED Course  I have reviewed the triage vital signs and the nursing notes.  Pertinent labs & imaging results that were available during my care of the patient were reviewed by me and considered in my medical decision making (see chart for details).        38 yo F with a chief complaints of chest pain and shortness of breath.  Most likely this is reflux based on history.  Patient has had reflux in the past and thinks that this feels completely different and is unwilling to accept that as a possibility.  Initial troponin is negative lab work is otherwise unremarkable.  She was mildly tachycardic on arrival and so I am unable to use the [facility] rule, will obtain ddimer.    D-dimer is negative.  Delta troponin is negative.  The patient is very upset so on discharge.  Tells me that I did not listen to her symptoms.  She is concerned that her hiatal hernia may be incarcerated.  I discussed with her that that is an unlikely possibility and that she likely has reflux as would be the most common cause of her symptoms based on history.  Patient again became upset and told me that she is on medicine for reflux and so that cannot cause her  symptoms.  I discussed with her that she could have exacerbations of reflux disease even on medications that she is prescribed.  She then told me that I did not care about her and she would not listen to any further explanation and stormed out of the ED saying that she would file a complaint.  3:26 AM:  I have discussed the diagnosis/risks/treatment options with the patient and believe the pt to be eligible for discharge home to follow-up with PCP. We also discussed returning to the ED immediately if new or worsening sx occur. We discussed the sx which are most concerning (e.g., sudden worsening pain, fever, inability to tolerate by mouth) that necessitate immediate return. Medications administered to the patient during their visit and any new prescriptions provided to the patient are listed below.  Medications given during this visit Medications  sodium chloride flush (NS) 0.9 % injection 3 mL (has no administration in time range)  alum & mag hydroxide-simeth (MAALOX/MYLANTA) 200-200-20 MG/5ML suspension 30 mL (30 mLs Oral Refused 01/16/19 0012)     The patient appears reasonably screen and/or stabilized for discharge and I doubt any other medical condition or other [facility] requiring further screening, evaluation, or treatment in the ED at this time prior to discharge.    Final Clinical Impressions(s) / ED Diagnoses   Final diagnoses:  Atypical chest pain    ED Discharge Orders    None       Deno Etienne, DO 01/16/19 3299

## 2019-01-16 NOTE — ED Notes (Signed)
ED Provider at bedside. 

## 2019-01-21 ENCOUNTER — Other Ambulatory Visit: Payer: Self-pay | Admitting: Gastroenterology

## 2019-01-22 ENCOUNTER — Other Ambulatory Visit: Payer: Self-pay | Admitting: Gastroenterology

## 2019-01-22 DIAGNOSIS — R7401 Elevation of levels of liver transaminase levels: Secondary | ICD-10-CM

## 2019-02-02 ENCOUNTER — Ambulatory Visit
Admission: RE | Admit: 2019-02-02 | Discharge: 2019-02-02 | Disposition: A | Discharge status: Home / Self Care | Payer: BC Managed Care – PPO | Source: Ambulatory Visit | Attending: Gastroenterology | Admitting: Gastroenterology

## 2019-02-02 DIAGNOSIS — R7401 Elevation of levels of liver transaminase levels: Secondary | ICD-10-CM

## 2019-08-23 ENCOUNTER — Other Ambulatory Visit (HOSPITAL_COMMUNITY): Payer: Self-pay | Admitting: Gastroenterology

## 2019-08-23 ENCOUNTER — Other Ambulatory Visit: Payer: Self-pay | Admitting: Gastroenterology

## 2019-08-23 DIAGNOSIS — R6881 Early satiety: Secondary | ICD-10-CM

## 2019-09-02 ENCOUNTER — Encounter (HOSPITAL_COMMUNITY): Payer: Self-pay

## 2019-09-02 ENCOUNTER — Encounter (HOSPITAL_COMMUNITY): Payer: BC Managed Care – PPO

## 2019-10-26 ENCOUNTER — Encounter: Payer: Self-pay | Admitting: Allergy and Immunology

## 2019-10-26 ENCOUNTER — Ambulatory Visit (INDEPENDENT_AMBULATORY_CARE_PROVIDER_SITE_OTHER): Payer: BC Managed Care – PPO | Admitting: Allergy and Immunology

## 2019-10-26 ENCOUNTER — Other Ambulatory Visit: Payer: Self-pay

## 2019-10-26 VITALS — BP 116/80 | HR 85 | Temp 99.7°F | Resp 18 | Ht 62.5 in | Wt 179.0 lb

## 2019-10-26 DIAGNOSIS — T781XXD Other adverse food reactions, not elsewhere classified, subsequent encounter: Secondary | ICD-10-CM | POA: Diagnosis not present

## 2019-10-26 DIAGNOSIS — J3089 Other allergic rhinitis: Secondary | ICD-10-CM

## 2019-10-26 DIAGNOSIS — K200 Eosinophilic esophagitis: Secondary | ICD-10-CM | POA: Insufficient documentation

## 2019-10-26 DIAGNOSIS — H1010 Acute atopic conjunctivitis, unspecified eye: Secondary | ICD-10-CM | POA: Insufficient documentation

## 2019-10-26 DIAGNOSIS — T781XXA Other adverse food reactions, not elsewhere classified, initial encounter: Secondary | ICD-10-CM | POA: Insufficient documentation

## 2019-10-26 DIAGNOSIS — H1013 Acute atopic conjunctivitis, bilateral: Secondary | ICD-10-CM | POA: Diagnosis not present

## 2019-10-26 DIAGNOSIS — J4520 Mild intermittent asthma, uncomplicated: Secondary | ICD-10-CM

## 2019-10-26 MED ORDER — EPINEPHRINE 0.3 MG/0.3ML IJ SOAJ: INTRAMUSCULAR | 3 refills | Status: AC

## 2019-10-26 MED ORDER — LEVOCETIRIZINE DIHYDROCHLORIDE 5 MG PO TABS: 5.0000 mg | ORAL_TABLET | Freq: Every day | ORAL | 5 refills | Status: DC | PRN

## 2019-10-26 MED ORDER — AZELASTINE HCL 0.1 % NA SOLN: NASAL | 5 refills | Status: AC

## 2019-10-26 NOTE — Assessment & Plan Note (Addendum)
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 Peeling or cooking the food has shown to reduce symptoms and antihistamines may also relieve symptoms. Immunotherapy to the cross reacting pollens has improved or cured PFAS in many patients, though this has not been consistent for all patients. Typically PFAS is limited to itching or swelling of mucosal tissues from the lips to the back of the throat. Given the patient's reactivity on skin tested carrots as well as history of ocular symptoms and mild angioedema of the lips, an epinephrine autoinjector 2 pack will be prescribed.  Information about PFAS has been discussed and provided in written form.  All foods causing symptoms, particularly carrots, are to be avoided.  Should symptoms progress beyond the mouth and throat, epinephrine is to be administered and 911 is to be called immediately.  A prescription has been provided for epinephrine 0.3 mg autoinjector (AuviQ) 2 pack along with instructions for its proper administration.  A food allergy action plan has been provided.

## 2019-10-26 NOTE — Patient Instructions (Addendum)
Eosinophilic esophagitis  Continue avoidance of dairy and gluten, as well as other recommendations per your gastroenterologist.  Follow-up with your gastroenterologist as recommended.  Given your history of asthma as well as EoE, we may consider an anti-IL 5 agent, mepolizomab (Nucala) or benralizumab Harrington Challenger).  Information has been provided.  Seasonal and perennial allergic rhinitis  Aeroallergen avoidance measures have been discussed and provided in written form.  A prescription has been provided for levocetirizine(Xyzal), 5 mg daily as needed.  To avoid diminishing benefit with daily use (tachyphylaxis) of second generation antihistamine, consider alternating every few months between fexofenadine (Allegra) and levocetirizine (Xyzal).  A prescription has been provided for [facility], 2 actuations per nostril twice a day if needed. Proper technique has been discussed and demonstrated.  Nasal saline spray (i.e., Simply Saline) or nasal saline lavage (i.e., NeilMed) is recommended as needed and prior to medicated nasal sprays.  The risks and benefits of aeroallergen immunotherapy have been discussed. The patient is interested in the possibility of initiating immunotherapy if insurance coverage is favorable. She will let us know how she would like to proceed.  Allergic conjunctivitis  Treatment plan as outlined above for allergic rhinitis.  Continue over-the-counter Pataday Extra Strength.  I have also recommended eye lubricant drops (i.e., Natural Tears) as needed.  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 Peeling or cooking the food has shown to reduce symptoms and antihistamines may also relieve symptoms. Immunotherapy to the cross reacting pollens has improved or cured PFAS in many patients, though this has not been consistent for all patients. Typically PFAS is limited to itching or swelling of  mucosal tissues from the lips to the back of the throat. Given the patient's reactivity on skin tested carrots as well as history of ocular symptoms and mild angioedema of the lips, an epinephrine autoinjector 2 pack will be prescribed.  Information about PFAS has been discussed and provided in written form.  All foods causing symptoms, particularly carrots, are to be avoided.  Should symptoms progress beyond the mouth and throat, epinephrine is to be administered and 911 is to be called immediately.  A prescription has been provided for epinephrine 0.3 mg autoinjector (AuviQ) 2 pack along with instructions for its proper administration.  A food allergy action plan has been provided.  Mild intermittent asthma Spirometry today reveals normal ventilatory function while asymptomatic.  Continue albuterol HFA, 1 to 2 inhalations every 4-6 hours if needed. May also use albuterol 15 minutes prior to exercise.  Subjective and objective measures of pulmonary function will be followed and the treatment plan will be adjusted accordingly.   Return in about 3 months (around 01/26/2020), or if symptoms worsen or fail to improve.   Control of Dust Mite Allergen  House dust mites play a major role in allergic asthma and rhinitis.  They occur in environments with high humidity wherever human skin, the food for dust mites is found. High levels have been detected in dust obtained from mattresses, pillows, carpets, upholstered furniture, bed covers, clothes and soft toys.  The principal allergen of the house dust mite is found in its feces.  A gram of dust may contain 1,000 mites and 250,000 fecal particles.  Mite antigen is easily measured in the air during house cleaning activities.    1. Encase mattresses, including the box spring, and pillow, in an air tight cover.  Seal the zipper end of the encased mattresses with wide  adhesive tape. 2. Wash the bedding in water of 130 degrees Farenheit weekly.  Avoid  cotton comforters/quilts and flannel bedding: the most ideal bed covering is the dacron comforter. 3. Remove all upholstered furniture from the bedroom. 4. Remove carpets, carpet padding, rugs, and non-washable window drapes from the bedroom.  Wash drapes weekly or use plastic window coverings. 5. Remove all non-washable stuffed toys from the bedroom.  Wash stuffed toys weekly. 6. Have the room cleaned frequently with a vacuum cleaner and a damp dust-mop.  The patient should not be in a room which is being cleaned and should wait 1 hour after cleaning before going into the room. 7. Close and seal all heating outlets in the bedroom.  Otherwise, the room will become filled with dust-laden air.  An electric heater can be used to heat the room. Reduce indoor humidity to less than 50%.  Do not use a humidifier.   Reducing Pollen Exposure  The American Academy of Allergy, Asthma and Immunology suggests the following steps to reduce your exposure to pollen during allergy seasons.    1. Do not hang sheets or clothing out to dry; pollen may collect on these items. 2. Do not mow lawns or spend time around freshly cut grass; mowing stirs up pollen. 3. Keep windows closed at night.  Keep car windows closed while driving. 4. Minimize morning activities outdoors, a time when pollen counts are usually at their highest. 5. Stay indoors as much as possible when pollen counts or humidity is high and on windy days when pollen tends to remain in the air longer. 6. Use air conditioning when possible.  Many air conditioners have filters that trap the pollen spores. 7. Use a HEPA room air filter to remove pollen form the indoor air you breathe.   Control of Dog or Cat Allergen  Avoidance is the best way to manage a dog or cat allergy. If you have a dog or cat and are allergic to dog or cats, consider removing the dog or cat from the home. If you have a dog or cat but dont want to find it a new home, or if your  family wants a pet even though someone in the household is allergic, here are some strategies that may help keep symptoms at bay:  1. Keep the pet out of your bedroom and restrict it to only a few rooms. Be advised that keeping the dog or cat in only one room will not limit the allergens to that room. 2. Dont pet, hug or kiss the dog or cat; if you do, wash your hands with soap and water. 3. High-efficiency particulate air (HEPA) cleaners run continuously in a bedroom or living room can reduce allergen levels over time. 4. Place electrostatic material sheet in the air inlet vent in the bedroom. 5. Regular use of a high-efficiency vacuum cleaner or a central vacuum can reduce allergen levels. 6. Giving your dog or cat a bath at least once a week can reduce airborne allergen.

## 2019-10-26 NOTE — Assessment & Plan Note (Signed)
   Continue avoidance of dairy and gluten, as well as other recommendations per your gastroenterologist.  Follow-up with your gastroenterologist as recommended.  Given your history of asthma as well as EoE, we may consider an anti-IL 5 agent, mepolizomab (Nucala) or benralizumab Harrington Challenger).  Information has been provided.

## 2019-10-26 NOTE — Assessment & Plan Note (Addendum)
   Aeroallergen avoidance measures have been discussed and provided in written form.  A prescription has been provided for levocetirizine(Xyzal), 5 mg daily as needed.  To avoid diminishing benefit with daily use (tachyphylaxis) of second generation antihistamine, consider alternating every few months between fexofenadine (Allegra) and levocetirizine (Xyzal).  A prescription has been provided for [facility], 2 actuations per nostril twice a day if needed. Proper technique has been discussed and demonstrated.  Nasal saline spray (i.e., Simply Saline) or nasal saline lavage (i.e., NeilMed) is recommended as needed and prior to medicated nasal sprays.  The risks and benefits of aeroallergen immunotherapy have been discussed. The patient is interested in the possibility of initiating immunotherapy if insurance coverage is favorable. She will let us know how she would like to proceed.

## 2019-10-26 NOTE — Assessment & Plan Note (Signed)
   Treatment plan as outlined above for allergic rhinitis.  Continue over-the-counter Pataday Extra Strength.  I have also recommended eye lubricant drops (i.e., Natural Tears) as needed.

## 2019-10-26 NOTE — Assessment & Plan Note (Signed)
Spirometry today reveals normal ventilatory function while asymptomatic.  Continue albuterol HFA, 1 to 2 inhalations every 4-6 hours if needed. May also use albuterol 15 minutes prior to exercise.  Subjective and objective measures of pulmonary function will be followed and the treatment plan will be adjusted accordingly.

## 2019-10-26 NOTE — Progress Notes (Signed)
New Patient Note  RE: Lauren KickRebekah L [name] MRN: [id number] DOB: March 29, 1980 Date of Office Visit: 10/26/2019  Referring provider: Raynelle JanSpry, Heather M., MD Primary care provider: [facility]  Chief Complaint: Allergic Rhinitis , Conjunctivitis, and Food Intolerance   History of present illness: Lauren [name] is a 39 y.o. female seen today in consultation requested by Joetta MannersHeather Spry, MD.  She was diagnosed with eosinophilic esophagitis in November 2020 EGD biopsy revealing 27 eosinophils per high-power field.  The EGD was prompted by an ER visit for food impaction.  Since childhood, she had periods episodes of dysphagia, typically with breads, chicken, and hotdogs.  She was initially prescribed omeprazole and Flovent swallowed, however the Flovent was $300 a month, therefore she discontinued this medication as well as the omeprazole.  In December 2020, she started an 8 food elimination diet, reintroducing foods in a stepwise fashion.  Through this process, she found that dairy products and gluten seem to correlate with symptoms.  Therefore, she has kept dairy products and gluten out of her diet with significant relief of EOE symptoms.  However, while eliminating gluten and dairy, her IBS has been less well-controlled.  She has started taking IBgard with some benefit. She was diagnosed with asthma when she was 39 years old.  Her asthma symptoms, primarily consisting of wheezing and coughing, are triggered by environmental allergens in the spring and, to a lesser degree, the fall, exercise, and upper respiratory tract infections.  She uses albuterol when needed with rapid, temporary relief.  Lauren [name] experiences nasal congestion, rhinorrhea, sneezing, nasal pruritus, and ocular pruritus.  The symptoms are most frequent and severe during the springtime, as well as the fall.  She has attempted to control the symptoms with fluticasone nasal spray, fexofenadine, and olopatadine eyedrops with partial  relief. Since childhood she has experienced oral pruritus with the consumption of melons, apples, peaches, cherries, and kiwi.  She also experiences oral pruritus with carrots but notes that if she even shreds carrots and touches her eyes she has to use eyedrops because of ocular pruritus and lacrimation.  She has noted that if she eats any of these foods when she has cracked lips she will develop mild lip swelling.  She denies generalized urticaria, cardiopulmonary symptoms, and other GI symptoms.  She has noted that if she consumes these foods after having been cooked she does not experience symptoms.  Assessment and plan: Eosinophilic esophagitis  Continue avoidance of dairy and gluten, as well as other recommendations per your gastroenterologist.  Follow-up with your gastroenterologist as recommended.  Given your history of asthma as well as EoE, we may consider an anti-IL 5 agent, mepolizomab (Nucala) or benralizumab Harrington Challenger(Fasenra).  Information has been provided.  Seasonal and perennial allergic rhinitis  Aeroallergen avoidance measures have been discussed and provided in written form.  A prescription has been provided for levocetirizine(Xyzal), 5 mg daily as needed.  To avoid diminishing benefit with daily use (tachyphylaxis) of second generation antihistamine, consider alternating every few months between fexofenadine (Allegra) and levocetirizine (Xyzal).  [facility] 2 actuations per nostril twice a day if needed. Proper technique has been discussed and demonstrated.  Nasal saline spray (i.e., Simply Saline) or nasal saline lavage (i.e., NeilMed) is recommended as needed and prior to medicated nasal sprays.  The risks and benefits of aeroallergen immunotherapy have been discussed. The patient is interested in the possibility of initiating immunotherapy if insurance coverage is favorable. She will let us know how  she would like to proceed.  Allergic  conjunctivitis  Treatment plan as outlined above for allergic rhinitis.  Continue over-the-counter Pataday Extra Strength.  I have also recommended eye lubricant drops (i.e., Natural Tears) as needed.  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 Peeling or cooking the food has shown to reduce symptoms and antihistamines may also relieve symptoms. Immunotherapy to the cross reacting pollens has improved or cured PFAS in many patients, though this has not been consistent for all patients. Typically PFAS is limited to itching or swelling of mucosal tissues from the lips to the back of the throat. Given the patient's reactivity on skin tested carrots as well as history of ocular symptoms and mild angioedema of the lips, an epinephrine autoinjector 2 pack will be prescribed.  Information about PFAS has been discussed and provided in written form.  All foods causing symptoms, particularly carrots, are to be avoided.  Should symptoms progress beyond the mouth and throat, epinephrine is to be administered and 911 is to be called immediately.  A prescription has been provided for epinephrine 0.3 mg autoinjector (AuviQ) 2 pack along with instructions for its proper administration.  A food allergy action plan has been provided.  Mild intermittent asthma Spirometry today reveals normal ventilatory function while asymptomatic.  Continue albuterol HFA, 1 to 2 inhalations every 4-6 hours if needed. May also use albuterol 15 minutes prior to exercise.  Subjective and objective measures of pulmonary function will be followed and the treatment plan will be adjusted accordingly.   Meds ordered this encounter  Medications  . EPINEPHrine (AUVI-Q) 0.3 mg/0.3 mL IJ SOAJ injection    Sig: Use as directed for severe allergic reaction    Dispense:  4 each    Refill:  3  . levocetirizine (XYZAL) 5 MG tablet    Sig: Take 1  tablet (5 mg total) by mouth daily as needed for allergies.    Dispense:  30 tablet    Refill:  5  . azelastine (ASTELIN) 0.1 % nasal spray    Sig: 1-2 sprays per nostril twice daily as needed    Dispense:  30 mL    Refill:  5    Diagnostics: Spirometry: Normal with an FEV1 of 100% predicted and an FEV1 ratio of 96%. This study was performed while the patient was asymptomatic.  Please see scanned spirometry results for details. Epicutaneous testing: Robust reactivity to grass pollen, weed pollen, ragweed pollen, tree pollen, and dust mite antigen.  Equivocal to cockroach antigen. Intradermal testing: Reactive to dog epithelia. Food allergen skin testing: Reactive to carrot and equivocal to cottonseed.    Physical examination: Blood pressure 116/80, pulse 85, temperature 99.7 F (37.6 C), temperature source Oral, resp. rate 18, height 5' 2.5" (1.588 m), weight 179 lb (81.2 kg), SpO2 98 %, unknown if currently breastfeeding.  General: Alert, interactive, in no acute distress. HEENT: TMs pearly gray, turbinates moderately edematous with clear discharge, post-pharynx moderately erythematous. Neck: Supple without lymphadenopathy. Lungs: Clear to auscultation without wheezing, rhonchi or rales. CV: Normal S1, S2 without murmurs. Abdomen: Nondistended, nontender. Skin: Warm and dry, without lesions or rashes. Extremities:  No clubbing, cyanosis or edema. Neuro:   Grossly intact.  Review of systems:  Review of systems negative except as noted in HPI / PMHx or noted below: Review of Systems  Constitutional: Negative.   HENT: Negative.   Eyes: Negative.   Respiratory: Negative.   Cardiovascular:  Negative.   Gastrointestinal: Negative.   Genitourinary: Negative.   Musculoskeletal: Negative.   Skin: Negative.   Neurological: Negative.   Endo/Heme/Allergies: Negative.   Psychiatric/Behavioral: Negative.     Past medical history:  Past Medical History:  Diagnosis Date  . Active  labor 05/14/2011  . Anxiety   . Asthma    uses fast acting inhaler during allergy season  . Cholelithiasis    during pregnancy  . Complication of anesthesia    states allergic to general anesthesia  . Complication of anesthesia    inability to break down certain anesthetic meds  . Depression   . Family history of malignant neoplasm of breast   . Female infertility associated with anovulation   . GERD (gastroesophageal reflux disease)   . Headache    Migraines with periods  . History of chicken pox   . Other specified diseases of blood and blood-forming organs(289.89)    pseudocholinesterase deficiency  . Pseudocholinesterase deficiency   . Thyroid nodule   . Vaginal Pap smear, abnormal   . Velamentous insertion of umbilical cord 05/14/2011   with last pregnancy    Past surgical history:  Past Surgical History:  Procedure Laterality Date  . CHOLECYSTECTOMY     2011  . COLONOSCOPY    . DILATION AND EVACUATION  05/15/2011   Procedure: DILATATION AND EVACUATION;  Surgeon: Genia Del, MD;  Location: [facility];  Service: Gynecology;  Laterality: N/A;  repair of bilateral  vulva tear, and second degree laceration  . DILITATION & CURRETTAGE/HYSTROSCOPY WITH NOVASURE ABLATION N/A 02/20/2017   Procedure: DILATATION & CURETTAGE/HYSTEROSCOPY WITH NOVASURE ABLATION;  Surgeon: Olivia Mackie, MD;  Location: [facility];  Service: Gynecology;  Laterality: N/A;  Pseudocholinesterase deficiency  . INDUCED ABORTION    . UPPER GI ENDOSCOPY      Family history: Family History  Problem Relation Age of Onset  . Hypertension Mother   . Cancer Maternal Grandmother        leukemia  . Anesthesia problems Neg Hx   . Allergic rhinitis Neg Hx   . Angioedema Neg Hx   . Asthma Neg Hx   . Atopy Neg Hx   . Eczema Neg Hx   . Immunodeficiency Neg Hx   . Urticaria Neg Hx     Social history: Social History   Socioeconomic History  . Marital status: Married    Spouse name: Not on file  . Number  of children: Not on file  . Years of education: Not on file  . Highest education level: Not on file  Occupational History  . Not on file  Tobacco Use  . Smoking status: Former Games developer  . Smokeless tobacco: Never Used  . Tobacco comment: quit 9 yrs ago  Vaping Use  . Vaping Use: Never used  Substance and Sexual Activity  . Alcohol use: Yes    Alcohol/week: 2.0 standard drinks    Types: 2 Glasses of wine per week    Comment: occ  . Drug use: No  . Sexual activity: Yes  Other Topics Concern  . Not on file  Social History Narrative  . Not on file   Social Determinants of Health   Financial Resource Strain:   . Difficulty of Paying Living Expenses: Not on file  Food Insecurity:   . Worried About Programme researcher, broadcasting/film/video in the Last Year: Not on file  . Ran Out of Food in the Last Year: Not on file  Transportation Needs:   . Lack of  Transportation (Medical): Not on file  . Lack of Transportation (Non-Medical): Not on file  Physical Activity:   . Days of Exercise per Week: Not on file  . Minutes of Exercise per Session: Not on file  Stress:   . Feeling of Stress : Not on file  Social Connections:   . Frequency of Communication with Friends and Family: Not on file  . Frequency of Social Gatherings with Friends and Family: Not on file  . Attends Religious Services: Not on file  . Active Member of Clubs or Organizations: Not on file  . Attends Banker Meetings: Not on file  . Marital Status: Not on file  Intimate Partner Violence:   . Fear of Current or Ex-Partner: Not on file  . Emotionally Abused: Not on file  . Physically Abused: Not on file  . Sexually Abused: Not on file    Environmental History: The patient lives in a 57-year-old house with carpeting in the bedroom, gas heat, and central air.  There is a dog in the home which has access to her bedroom.  There is no known mold/water damage in the home.  She is a former cigarette smoker having quit more than 10  years ago.  Current Outpatient Medications  Medication Sig Dispense Refill  . albuterol (PROVENTIL HFA;VENTOLIN HFA) 108 (90 BASE) MCG/ACT inhaler Inhale 1-2 puffs into the lungs every 6 (six) hours as needed for wheezing or shortness of breath.    . ketotifen (ZADITOR) 0.025 % ophthalmic solution Place 1 drop into both eyes 2 times daily.    . Multiple Vitamin (MULTIVITAMIN WITH MINERALS) TABS tablet Take 1 tablet by mouth daily.    Marland Kitchen azelastine (ASTELIN) 0.1 % nasal spray 1-2 sprays per nostril twice daily as needed 30 mL 5  . EPINEPHrine (AUVI-Q) 0.3 mg/0.3 mL IJ SOAJ injection Use as directed for severe allergic reaction 4 each 3  . fexofenadine (ALLEGRA) 180 MG tablet Take 180 mg by mouth daily. (Patient not taking: Reported on 10/26/2019)    . levocetirizine (XYZAL) 5 MG tablet Take 1 tablet (5 mg total) by mouth daily as needed for allergies. 30 tablet 5   No current facility-administered medications for this visit.    Known medication allergies: Allergies  Allergen Reactions  . Bupropion Other (See Comments)    Headaches  . Other Other (See Comments)    Problems with "general anaesthesia" due to pseudocholinesterase deficiency   . Penicillins Other (See Comments)    Childhood allergy; reaction unknown Has patient had a PCN reaction causing immediate rash, facial/tongue/throat swelling, SOB or lightheadedness with hypotension: Unknown Has patient had a PCN reaction causing severe rash involving mucus membranes or skin necrosis: Unknown Has patient had a PCN reaction that required hospitalization: Unknown Has patient had a PCN reaction occurring within the last 10 years: No If all of the above answers are "NO", then may proceed with Cephalosporin use.   . Ritalin [Methylphenidate Hcl] Other (See Comments)    ADHD/AntiNarcolepsy/Anti-obesity/ Anorexiants cause muscle spasms    I appreciate the opportunity to take part in Bobbijo's care. Please do not hesitate to contact me with  questions.  Sincerely,   R. Jorene Guest, MD

## 2019-10-28 ENCOUNTER — Telehealth: Payer: Self-pay | Admitting: Allergy and Immunology

## 2019-10-28 MED ORDER — LEVOCETIRIZINE DIHYDROCHLORIDE 5 MG PO TABS: 5.0000 mg | ORAL_TABLET | Freq: Every day | ORAL | 5 refills | Status: AC | PRN

## 2019-10-28 NOTE — Telephone Encounter (Signed)
Spoke with pt, the xhance was not covered, so Dr. Nunzio Cobbs sent the azelastine in stead. The levoceterizine was going to be cheaper at the [facility]- med was sent.

## 2019-10-28 NOTE — Telephone Encounter (Signed)
Pt. Ask if she should pick up the azelastine that was prescribed or if she is to wait for Timmothy Sours which was talked about at the appt. And would like the levocetirizine sent to [facility] instead of [facility].

## 2019-12-21 ENCOUNTER — Other Ambulatory Visit: Payer: Self-pay | Admitting: Obstetrics and Gynecology

## 2019-12-21 DIAGNOSIS — N63 Unspecified lump in unspecified breast: Secondary | ICD-10-CM

## 2020-01-05 ENCOUNTER — Ambulatory Visit
Admission: RE | Admit: 2020-01-05 | Discharge: 2020-01-05 | Disposition: A | Discharge status: Home / Self Care | Payer: BC Managed Care – PPO | Source: Ambulatory Visit | Attending: Obstetrics and Gynecology | Admitting: Obstetrics and Gynecology

## 2020-01-05 ENCOUNTER — Other Ambulatory Visit: Payer: Self-pay

## 2020-01-05 DIAGNOSIS — N63 Unspecified lump in unspecified breast: Secondary | ICD-10-CM

## 2020-02-02 ENCOUNTER — Ambulatory Visit: Payer: BC Managed Care – PPO | Admitting: Allergy and Immunology

## 2021-08-31 IMAGING — MG DIGITAL DIAGNOSTIC BILAT W/ TOMO W/ CAD
6 of 10 series · 6 of 30 positions shown · non-contrast
Comparison: Previous exam(s).

CLINICAL DATA: Patient presents for palpable abnormality within the
upper-outer left breast.

EXAM:
DIGITAL DIAGNOSTIC BILATERAL MAMMOGRAM WITH CAD AND TOMO
ULTRASOUND LEFT BREAST

[L MLO synth-2D (1 of 2)]
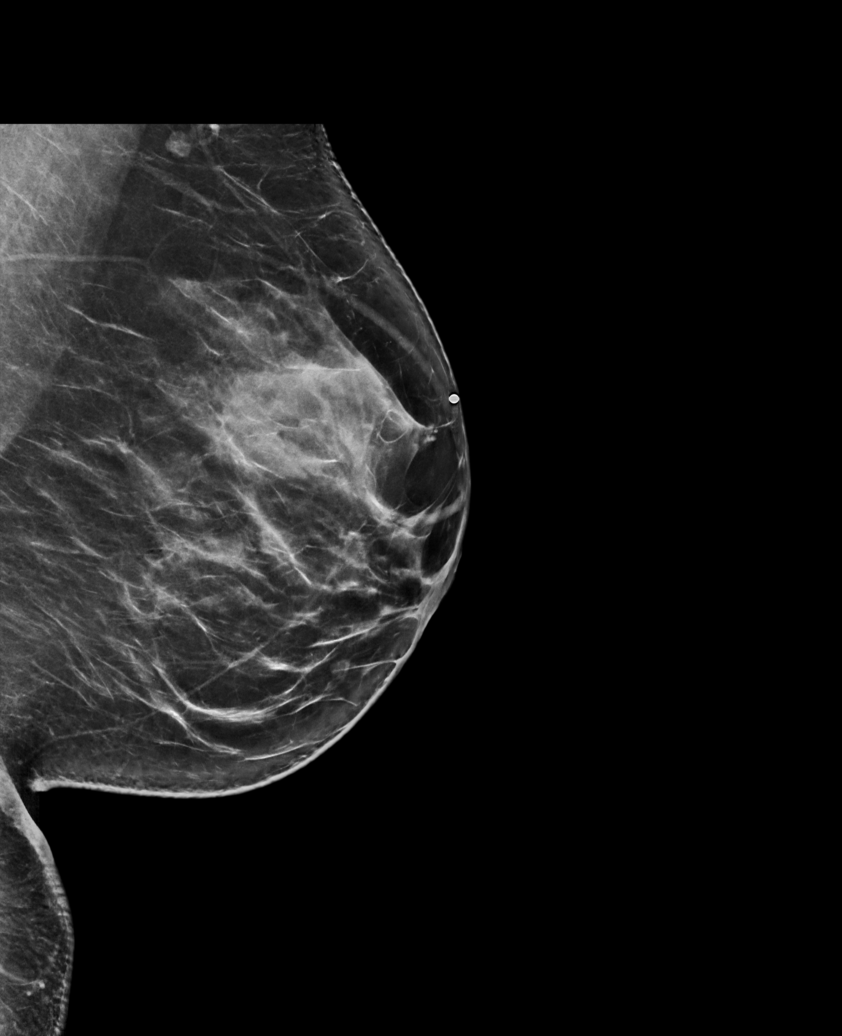

[R MLO synth-2D]
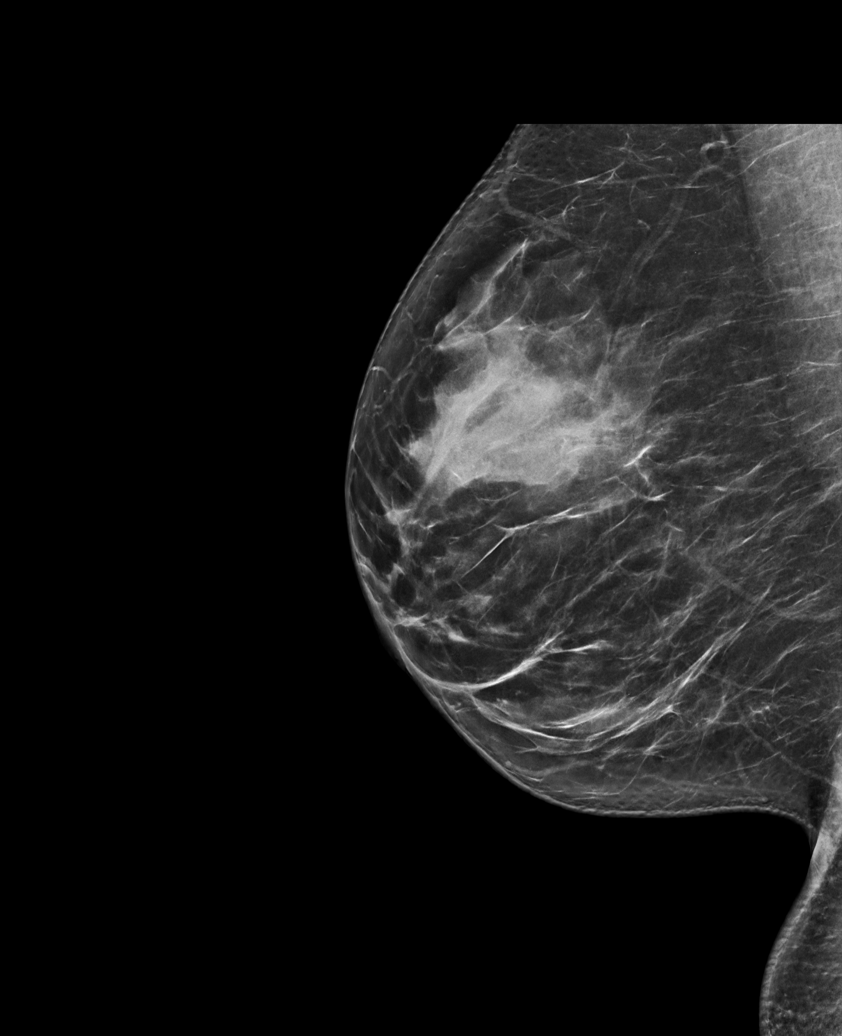

[L MLO synth-2D (2 of 2)]
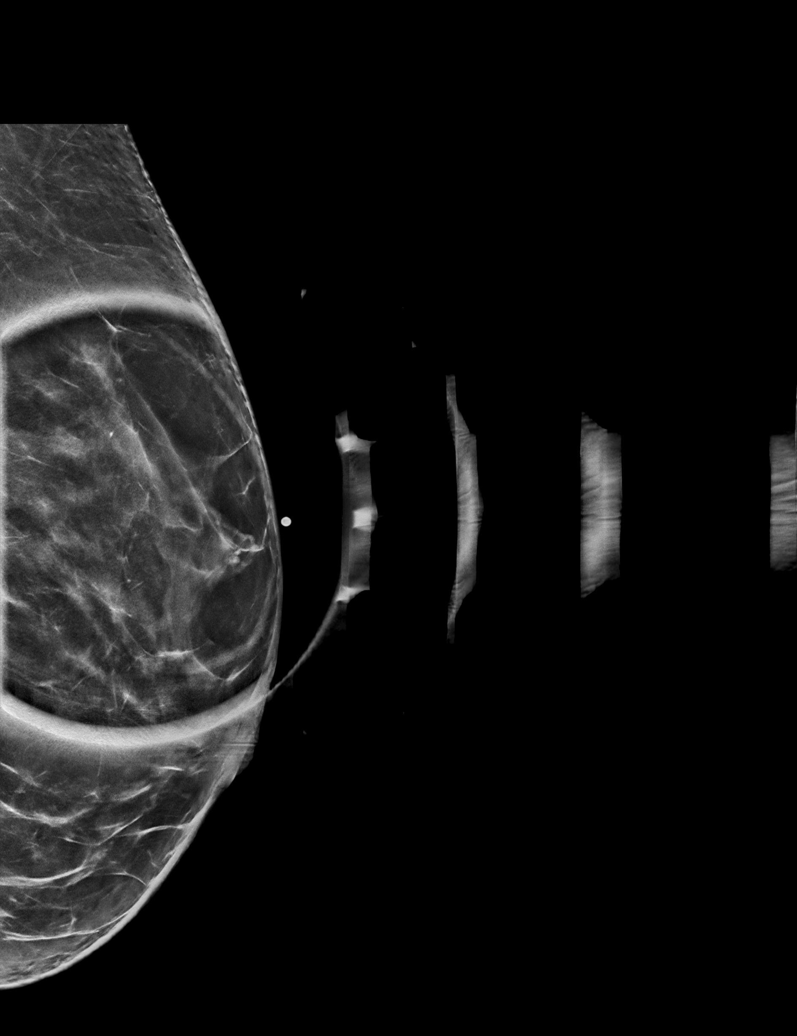

[L CC synth-2D]
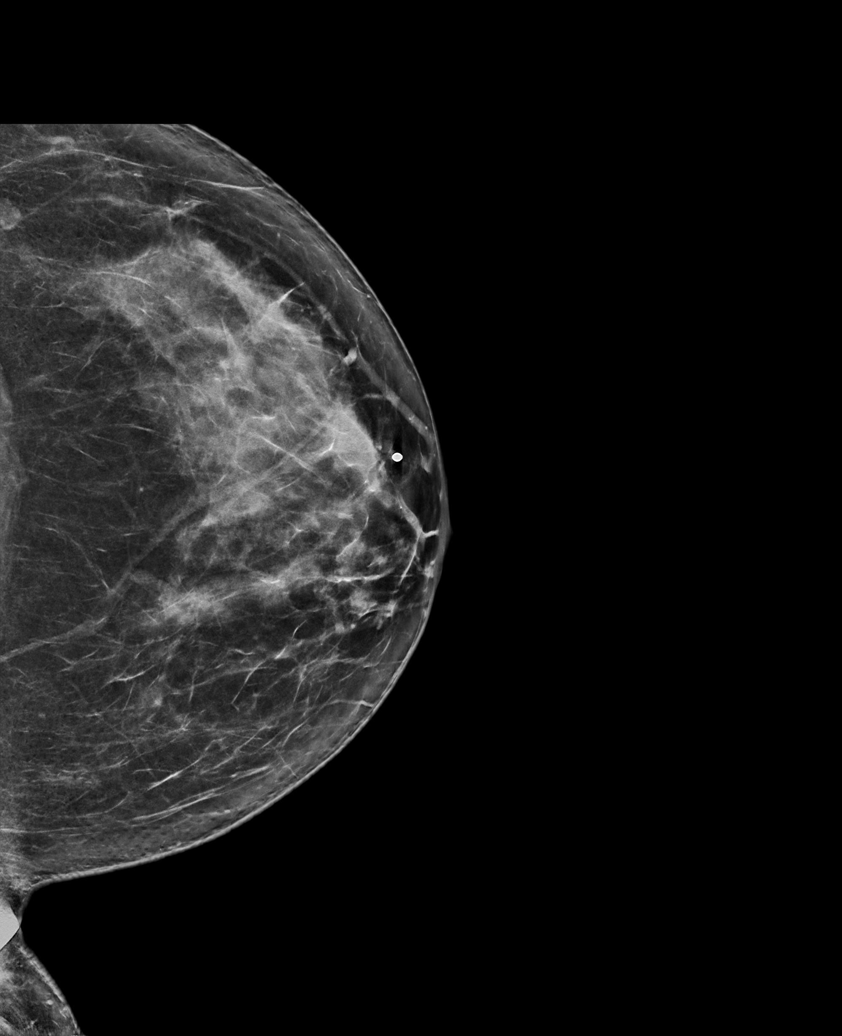

[R CC synth-2D]
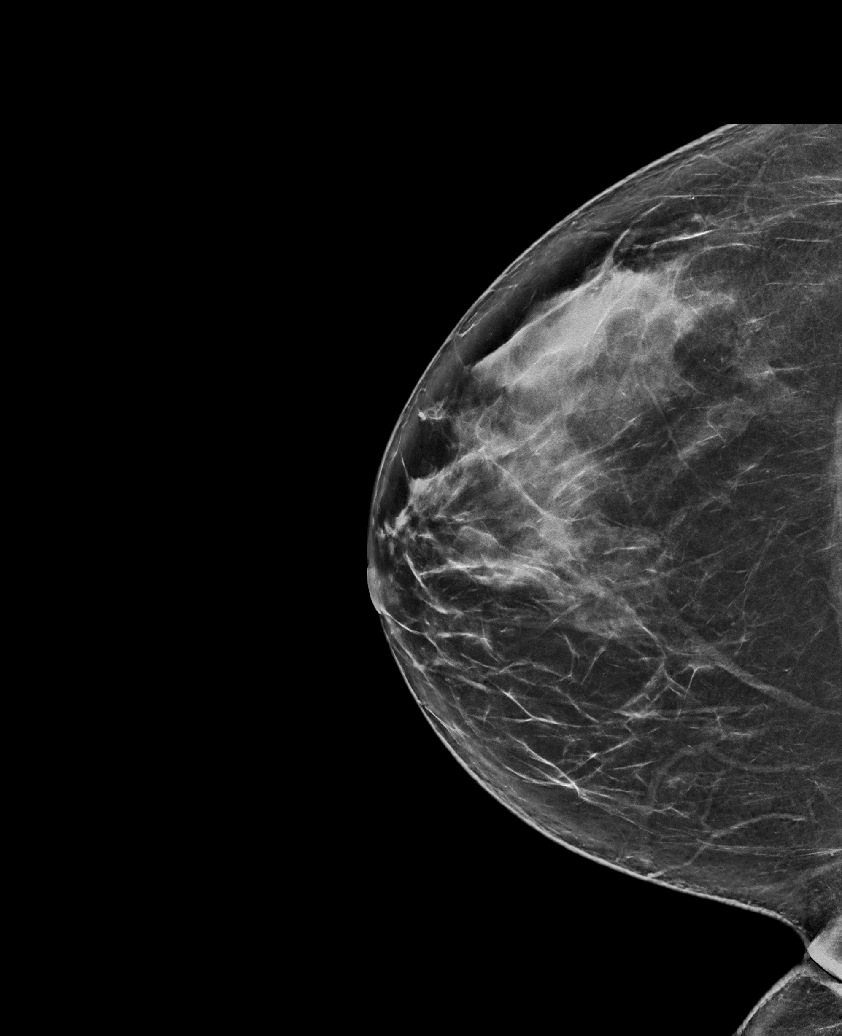

[R MLO tomo · tomo slice 43/84.0]
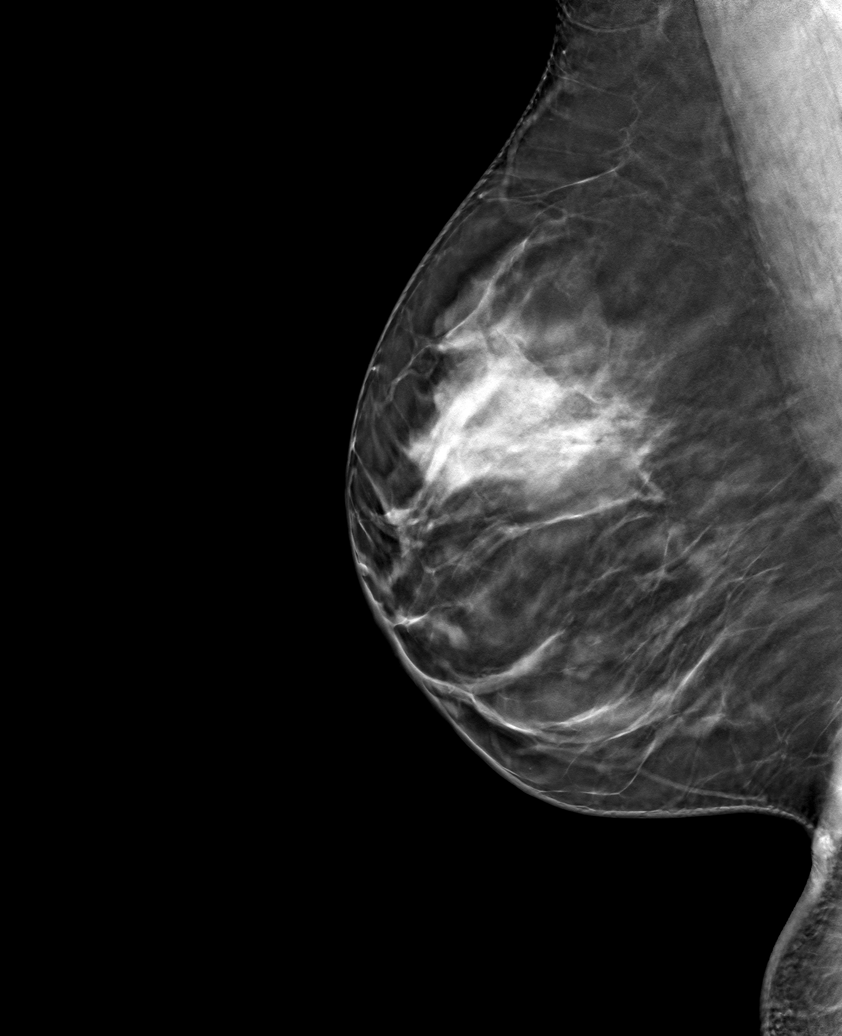

[6 of 30 positions shown; findings below may reference images not displayed]

ACR Breast Density Category c: The breast tissue is heterogeneously
dense, which may obscure small masses.
FINDINGS: No concerning masses, calcifications or nonsurgical distortion
identified within either breast. No concerning abnormality
underlying the site of palpable concern within the upper-outer left
breast.

Mammographic images were processed with CAD.

On physical exam, dense tissue is palpated within the anterior
superior left breast.

Targeted ultrasound is performed, showing normal dense tissue
without suspicious mass within the left breast 12:30 o'clock 3 cm
from the nipple.
IMPRESSION: No mammographic evidence for malignancy. No suspicious abnormality
at the site of palpable concern left breast.

RECOMMENDATION:
Screening mammogram in one year.(Code:KO-S-VPC).

Additionally, I discussed the option of bilateral breast MRI for
further evaluation given family history of breast cancer.

I have discussed the findings and recommendations with the patient.
If applicable, a reminder letter will be sent to the patient
regarding the next appointment.

BI-RADS CATEGORY  2: Benign.
